# Patient Record
Sex: Female | Born: 1960 | Race: White | Hispanic: No | Smoking: Current every day smoker
Health system: Southern US, Community
[De-identification: ages and names within clinical notes are randomized; demographics above are authoritative.]

## PROBLEM LIST (undated history)

## (undated) DIAGNOSIS — M5020 Other cervical disc displacement, unspecified cervical region: Secondary | ICD-10-CM

## (undated) DIAGNOSIS — M329 Systemic lupus erythematosus, unspecified: Secondary | ICD-10-CM

## (undated) DIAGNOSIS — IMO0002 Reserved for concepts with insufficient information to code with codable children: Secondary | ICD-10-CM

## (undated) DIAGNOSIS — N289 Disorder of kidney and ureter, unspecified: Secondary | ICD-10-CM

## (undated) DIAGNOSIS — G9050 Complex regional pain syndrome I, unspecified: Secondary | ICD-10-CM

## (undated) HISTORY — PX: ABDOMINAL HYSTERECTOMY: SHX81

## (undated) HISTORY — PX: OTHER SURGICAL HISTORY: SHX169

## (undated) HISTORY — PX: LAPAROSCOPIC LUMBAR SYMPATHECTOMY: SUR777

## (undated) HISTORY — PX: CHOLECYSTECTOMY: SHX55

## (undated) HISTORY — PX: ADENOIDECTOMY: SUR15

## (undated) HISTORY — PX: CARPAL TUNNEL RELEASE: SHX101

## (undated) HISTORY — PX: NEUROLYSIS MEDIAN NERVE: SUR892

## (undated) HISTORY — PX: TONSILLECTOMY: SUR1361

---

## 2015-05-02 DIAGNOSIS — N182 Chronic kidney disease, stage 2 (mild): Secondary | ICD-10-CM | POA: Diagnosis not present

## 2015-05-02 DIAGNOSIS — N189 Chronic kidney disease, unspecified: Secondary | ICD-10-CM | POA: Diagnosis not present

## 2015-05-20 DIAGNOSIS — G9050 Complex regional pain syndrome I, unspecified: Secondary | ICD-10-CM | POA: Diagnosis not present

## 2015-05-20 DIAGNOSIS — F1120 Opioid dependence, uncomplicated: Secondary | ICD-10-CM | POA: Diagnosis not present

## 2015-05-20 DIAGNOSIS — M5020 Other cervical disc displacement, unspecified cervical region: Secondary | ICD-10-CM | POA: Diagnosis not present

## 2015-06-18 DIAGNOSIS — Z79891 Long term (current) use of opiate analgesic: Secondary | ICD-10-CM | POA: Diagnosis not present

## 2015-06-18 DIAGNOSIS — M542 Cervicalgia: Secondary | ICD-10-CM | POA: Diagnosis not present

## 2015-06-18 DIAGNOSIS — M79601 Pain in right arm: Secondary | ICD-10-CM | POA: Diagnosis not present

## 2015-06-18 DIAGNOSIS — R202 Paresthesia of skin: Secondary | ICD-10-CM | POA: Diagnosis not present

## 2015-06-18 DIAGNOSIS — M25511 Pain in right shoulder: Secondary | ICD-10-CM | POA: Diagnosis not present

## 2015-06-18 DIAGNOSIS — M545 Low back pain: Secondary | ICD-10-CM | POA: Diagnosis not present

## 2015-06-18 DIAGNOSIS — R200 Anesthesia of skin: Secondary | ICD-10-CM | POA: Diagnosis not present

## 2015-06-18 DIAGNOSIS — G9050 Complex regional pain syndrome I, unspecified: Secondary | ICD-10-CM | POA: Diagnosis not present

## 2015-06-24 ENCOUNTER — Ambulatory Visit
Admission: RE | Admit: 2015-06-24 | Discharge: 2015-06-24 | Disposition: A | Discharge status: Home / Self Care | Payer: PPO | Source: Ambulatory Visit | Attending: Specialist | Admitting: Specialist

## 2015-06-24 ENCOUNTER — Other Ambulatory Visit: Payer: Self-pay | Admitting: Specialist

## 2015-06-24 DIAGNOSIS — M542 Cervicalgia: Secondary | ICD-10-CM

## 2015-06-24 DIAGNOSIS — M50322 Other cervical disc degeneration at C5-C6 level: Secondary | ICD-10-CM | POA: Diagnosis not present

## 2015-06-30 DIAGNOSIS — M5412 Radiculopathy, cervical region: Secondary | ICD-10-CM | POA: Diagnosis not present

## 2015-06-30 DIAGNOSIS — M542 Cervicalgia: Secondary | ICD-10-CM | POA: Diagnosis not present

## 2015-06-30 DIAGNOSIS — M4802 Spinal stenosis, cervical region: Secondary | ICD-10-CM | POA: Diagnosis not present

## 2015-07-03 ENCOUNTER — Other Ambulatory Visit: Payer: Self-pay | Admitting: Neurosurgery

## 2015-07-03 DIAGNOSIS — M5412 Radiculopathy, cervical region: Secondary | ICD-10-CM

## 2015-07-14 ENCOUNTER — Ambulatory Visit
Admission: RE | Admit: 2015-07-14 | Discharge: 2015-07-14 | Disposition: A | Discharge status: Home / Self Care | Payer: PPO | Source: Ambulatory Visit | Attending: Neurosurgery | Admitting: Neurosurgery

## 2015-07-14 DIAGNOSIS — M5030 Other cervical disc degeneration, unspecified cervical region: Secondary | ICD-10-CM | POA: Diagnosis not present

## 2015-07-14 DIAGNOSIS — M5412 Radiculopathy, cervical region: Secondary | ICD-10-CM

## 2015-07-16 DIAGNOSIS — M25511 Pain in right shoulder: Secondary | ICD-10-CM | POA: Diagnosis not present

## 2015-07-16 DIAGNOSIS — R202 Paresthesia of skin: Secondary | ICD-10-CM | POA: Diagnosis not present

## 2015-07-16 DIAGNOSIS — M542 Cervicalgia: Secondary | ICD-10-CM | POA: Diagnosis not present

## 2015-07-16 DIAGNOSIS — G9050 Complex regional pain syndrome I, unspecified: Secondary | ICD-10-CM | POA: Diagnosis not present

## 2015-07-16 DIAGNOSIS — M545 Low back pain: Secondary | ICD-10-CM | POA: Diagnosis not present

## 2015-08-12 DIAGNOSIS — M47812 Spondylosis without myelopathy or radiculopathy, cervical region: Secondary | ICD-10-CM | POA: Diagnosis not present

## 2015-08-12 DIAGNOSIS — M542 Cervicalgia: Secondary | ICD-10-CM | POA: Diagnosis not present

## 2015-08-13 DIAGNOSIS — M542 Cervicalgia: Secondary | ICD-10-CM | POA: Diagnosis not present

## 2015-08-13 DIAGNOSIS — Z79891 Long term (current) use of opiate analgesic: Secondary | ICD-10-CM | POA: Diagnosis not present

## 2015-08-13 DIAGNOSIS — M545 Low back pain: Secondary | ICD-10-CM | POA: Diagnosis not present

## 2015-08-13 DIAGNOSIS — R202 Paresthesia of skin: Secondary | ICD-10-CM | POA: Diagnosis not present

## 2015-08-13 DIAGNOSIS — G9050 Complex regional pain syndrome I, unspecified: Secondary | ICD-10-CM | POA: Diagnosis not present

## 2015-08-13 DIAGNOSIS — M25511 Pain in right shoulder: Secondary | ICD-10-CM | POA: Diagnosis not present

## 2015-08-21 DIAGNOSIS — M25511 Pain in right shoulder: Secondary | ICD-10-CM | POA: Diagnosis not present

## 2015-08-25 DIAGNOSIS — J439 Emphysema, unspecified: Secondary | ICD-10-CM | POA: Diagnosis not present

## 2015-08-25 DIAGNOSIS — M25511 Pain in right shoulder: Secondary | ICD-10-CM | POA: Diagnosis not present

## 2015-08-25 DIAGNOSIS — E559 Vitamin D deficiency, unspecified: Secondary | ICD-10-CM | POA: Diagnosis not present

## 2015-08-25 DIAGNOSIS — R911 Solitary pulmonary nodule: Secondary | ICD-10-CM | POA: Diagnosis not present

## 2015-08-25 DIAGNOSIS — G8929 Other chronic pain: Secondary | ICD-10-CM | POA: Diagnosis not present

## 2015-09-03 DIAGNOSIS — J439 Emphysema, unspecified: Secondary | ICD-10-CM | POA: Diagnosis not present

## 2015-09-03 DIAGNOSIS — R911 Solitary pulmonary nodule: Secondary | ICD-10-CM | POA: Diagnosis not present

## 2015-09-03 DIAGNOSIS — Z87891 Personal history of nicotine dependence: Secondary | ICD-10-CM | POA: Diagnosis not present

## 2015-09-04 DIAGNOSIS — M25511 Pain in right shoulder: Secondary | ICD-10-CM | POA: Diagnosis not present

## 2015-09-04 DIAGNOSIS — M7501 Adhesive capsulitis of right shoulder: Secondary | ICD-10-CM | POA: Diagnosis not present

## 2015-09-08 DIAGNOSIS — M791 Myalgia: Secondary | ICD-10-CM | POA: Diagnosis not present

## 2015-09-08 DIAGNOSIS — M542 Cervicalgia: Secondary | ICD-10-CM | POA: Diagnosis not present

## 2015-09-08 DIAGNOSIS — M545 Low back pain: Secondary | ICD-10-CM | POA: Diagnosis not present

## 2015-09-18 DIAGNOSIS — M25511 Pain in right shoulder: Secondary | ICD-10-CM | POA: Diagnosis not present

## 2015-09-23 DIAGNOSIS — S43431D Superior glenoid labrum lesion of right shoulder, subsequent encounter: Secondary | ICD-10-CM | POA: Diagnosis not present

## 2015-09-23 DIAGNOSIS — M7501 Adhesive capsulitis of right shoulder: Secondary | ICD-10-CM | POA: Diagnosis not present

## 2015-10-09 DIAGNOSIS — M542 Cervicalgia: Secondary | ICD-10-CM | POA: Diagnosis not present

## 2015-10-09 DIAGNOSIS — Z79891 Long term (current) use of opiate analgesic: Secondary | ICD-10-CM | POA: Diagnosis not present

## 2015-10-09 DIAGNOSIS — M545 Low back pain: Secondary | ICD-10-CM | POA: Diagnosis not present

## 2015-10-09 DIAGNOSIS — M791 Myalgia: Secondary | ICD-10-CM | POA: Diagnosis not present

## 2015-11-06 DIAGNOSIS — M791 Myalgia: Secondary | ICD-10-CM | POA: Diagnosis not present

## 2015-11-06 DIAGNOSIS — M542 Cervicalgia: Secondary | ICD-10-CM | POA: Diagnosis not present

## 2015-11-06 DIAGNOSIS — M545 Low back pain: Secondary | ICD-10-CM | POA: Diagnosis not present

## 2015-12-09 DIAGNOSIS — M545 Low back pain: Secondary | ICD-10-CM | POA: Diagnosis not present

## 2015-12-09 DIAGNOSIS — M791 Myalgia: Secondary | ICD-10-CM | POA: Diagnosis not present

## 2015-12-09 DIAGNOSIS — M542 Cervicalgia: Secondary | ICD-10-CM | POA: Diagnosis not present

## 2016-01-06 DIAGNOSIS — M791 Myalgia: Secondary | ICD-10-CM | POA: Diagnosis not present

## 2016-01-06 DIAGNOSIS — Z79891 Long term (current) use of opiate analgesic: Secondary | ICD-10-CM | POA: Diagnosis not present

## 2016-01-06 DIAGNOSIS — M542 Cervicalgia: Secondary | ICD-10-CM | POA: Diagnosis not present

## 2016-01-06 DIAGNOSIS — M545 Low back pain: Secondary | ICD-10-CM | POA: Diagnosis not present

## 2016-01-12 DIAGNOSIS — R918 Other nonspecific abnormal finding of lung field: Secondary | ICD-10-CM | POA: Diagnosis not present

## 2016-01-12 DIAGNOSIS — J438 Other emphysema: Secondary | ICD-10-CM | POA: Diagnosis not present

## 2016-01-12 DIAGNOSIS — N183 Chronic kidney disease, stage 3 (moderate): Secondary | ICD-10-CM | POA: Diagnosis not present

## 2016-02-03 DIAGNOSIS — M791 Myalgia: Secondary | ICD-10-CM | POA: Diagnosis not present

## 2016-02-03 DIAGNOSIS — M542 Cervicalgia: Secondary | ICD-10-CM | POA: Diagnosis not present

## 2016-02-03 DIAGNOSIS — M545 Low back pain: Secondary | ICD-10-CM | POA: Diagnosis not present

## 2016-02-26 DIAGNOSIS — E785 Hyperlipidemia, unspecified: Secondary | ICD-10-CM | POA: Diagnosis not present

## 2016-02-26 DIAGNOSIS — R079 Chest pain, unspecified: Secondary | ICD-10-CM | POA: Diagnosis not present

## 2016-02-26 DIAGNOSIS — Z23 Encounter for immunization: Secondary | ICD-10-CM | POA: Diagnosis not present

## 2016-03-04 DIAGNOSIS — M791 Myalgia: Secondary | ICD-10-CM | POA: Diagnosis not present

## 2016-03-04 DIAGNOSIS — M545 Low back pain: Secondary | ICD-10-CM | POA: Diagnosis not present

## 2016-03-04 DIAGNOSIS — M542 Cervicalgia: Secondary | ICD-10-CM | POA: Diagnosis not present

## 2016-04-01 DIAGNOSIS — Z79891 Long term (current) use of opiate analgesic: Secondary | ICD-10-CM | POA: Diagnosis not present

## 2016-04-01 DIAGNOSIS — M545 Low back pain: Secondary | ICD-10-CM | POA: Diagnosis not present

## 2016-04-01 DIAGNOSIS — M542 Cervicalgia: Secondary | ICD-10-CM | POA: Diagnosis not present

## 2016-04-01 DIAGNOSIS — M791 Myalgia: Secondary | ICD-10-CM | POA: Diagnosis not present

## 2016-04-29 DIAGNOSIS — M545 Low back pain: Secondary | ICD-10-CM | POA: Diagnosis not present

## 2016-04-29 DIAGNOSIS — M791 Myalgia: Secondary | ICD-10-CM | POA: Diagnosis not present

## 2016-04-29 DIAGNOSIS — Z79891 Long term (current) use of opiate analgesic: Secondary | ICD-10-CM | POA: Diagnosis not present

## 2016-04-29 DIAGNOSIS — M542 Cervicalgia: Secondary | ICD-10-CM | POA: Diagnosis not present

## 2016-05-27 DIAGNOSIS — M545 Low back pain: Secondary | ICD-10-CM | POA: Diagnosis not present

## 2016-05-27 DIAGNOSIS — M542 Cervicalgia: Secondary | ICD-10-CM | POA: Diagnosis not present

## 2016-05-27 DIAGNOSIS — M791 Myalgia: Secondary | ICD-10-CM | POA: Diagnosis not present

## 2016-06-25 DIAGNOSIS — E785 Hyperlipidemia, unspecified: Secondary | ICD-10-CM | POA: Diagnosis not present

## 2016-07-20 DIAGNOSIS — M545 Low back pain: Secondary | ICD-10-CM | POA: Diagnosis not present

## 2016-07-20 DIAGNOSIS — M791 Myalgia: Secondary | ICD-10-CM | POA: Diagnosis not present

## 2016-07-20 DIAGNOSIS — M542 Cervicalgia: Secondary | ICD-10-CM | POA: Diagnosis not present

## 2016-07-20 DIAGNOSIS — Z79891 Long term (current) use of opiate analgesic: Secondary | ICD-10-CM | POA: Diagnosis not present

## 2016-08-18 DIAGNOSIS — M542 Cervicalgia: Secondary | ICD-10-CM | POA: Diagnosis not present

## 2016-08-18 DIAGNOSIS — M791 Myalgia: Secondary | ICD-10-CM | POA: Diagnosis not present

## 2016-08-18 DIAGNOSIS — M545 Low back pain: Secondary | ICD-10-CM | POA: Diagnosis not present

## 2016-09-15 DIAGNOSIS — E785 Hyperlipidemia, unspecified: Secondary | ICD-10-CM | POA: Diagnosis not present

## 2016-09-15 DIAGNOSIS — N189 Chronic kidney disease, unspecified: Secondary | ICD-10-CM | POA: Diagnosis not present

## 2016-09-15 DIAGNOSIS — G9050 Complex regional pain syndrome I, unspecified: Secondary | ICD-10-CM | POA: Diagnosis not present

## 2016-09-21 DIAGNOSIS — M545 Low back pain: Secondary | ICD-10-CM | POA: Diagnosis not present

## 2016-09-21 DIAGNOSIS — M791 Myalgia: Secondary | ICD-10-CM | POA: Diagnosis not present

## 2016-09-21 DIAGNOSIS — E785 Hyperlipidemia, unspecified: Secondary | ICD-10-CM | POA: Diagnosis not present

## 2016-09-21 DIAGNOSIS — M542 Cervicalgia: Secondary | ICD-10-CM | POA: Diagnosis not present

## 2016-09-24 DIAGNOSIS — S238XXA Sprain of other specified parts of thorax, initial encounter: Secondary | ICD-10-CM | POA: Diagnosis not present

## 2016-09-28 DIAGNOSIS — N182 Chronic kidney disease, stage 2 (mild): Secondary | ICD-10-CM | POA: Diagnosis not present

## 2016-09-28 DIAGNOSIS — N183 Chronic kidney disease, stage 3 (moderate): Secondary | ICD-10-CM | POA: Diagnosis not present

## 2016-09-28 DIAGNOSIS — D649 Anemia, unspecified: Secondary | ICD-10-CM | POA: Diagnosis not present

## 2016-09-28 DIAGNOSIS — N281 Cyst of kidney, acquired: Secondary | ICD-10-CM | POA: Diagnosis not present

## 2016-10-19 DIAGNOSIS — M545 Low back pain: Secondary | ICD-10-CM | POA: Diagnosis not present

## 2016-10-19 DIAGNOSIS — G9050 Complex regional pain syndrome I, unspecified: Secondary | ICD-10-CM | POA: Diagnosis not present

## 2016-10-19 DIAGNOSIS — M791 Myalgia: Secondary | ICD-10-CM | POA: Diagnosis not present

## 2016-10-19 DIAGNOSIS — Z79891 Long term (current) use of opiate analgesic: Secondary | ICD-10-CM | POA: Diagnosis not present

## 2016-10-19 DIAGNOSIS — M542 Cervicalgia: Secondary | ICD-10-CM | POA: Diagnosis not present

## 2016-11-17 DIAGNOSIS — Z79891 Long term (current) use of opiate analgesic: Secondary | ICD-10-CM | POA: Diagnosis not present

## 2016-11-17 DIAGNOSIS — M329 Systemic lupus erythematosus, unspecified: Secondary | ICD-10-CM | POA: Diagnosis not present

## 2016-11-17 DIAGNOSIS — M542 Cervicalgia: Secondary | ICD-10-CM | POA: Diagnosis not present

## 2016-11-17 DIAGNOSIS — M791 Myalgia: Secondary | ICD-10-CM | POA: Diagnosis not present

## 2016-11-17 DIAGNOSIS — M545 Low back pain: Secondary | ICD-10-CM | POA: Diagnosis not present

## 2016-11-18 DIAGNOSIS — R0600 Dyspnea, unspecified: Secondary | ICD-10-CM | POA: Diagnosis not present

## 2016-11-18 DIAGNOSIS — Z122 Encounter for screening for malignant neoplasm of respiratory organs: Secondary | ICD-10-CM | POA: Diagnosis not present

## 2016-11-18 DIAGNOSIS — F17210 Nicotine dependence, cigarettes, uncomplicated: Secondary | ICD-10-CM | POA: Diagnosis not present

## 2016-11-18 DIAGNOSIS — J449 Chronic obstructive pulmonary disease, unspecified: Secondary | ICD-10-CM | POA: Diagnosis not present

## 2016-12-03 IMAGING — CR DG CERVICAL SPINE COMPLETE 4+V
5 series · 5 of 5 positions shown · non-contrast
Comparison: None in PACs

CLINICAL DATA: Chronic pain with increased symptoms over the past 6
weeks with numbness extending to the right fingers. Limited range of
motion ; history of end-stage renal disease.

EXAM:
CERVICAL SPINE - COMPLETE 4+ VIEW

[w cervical spine lat]
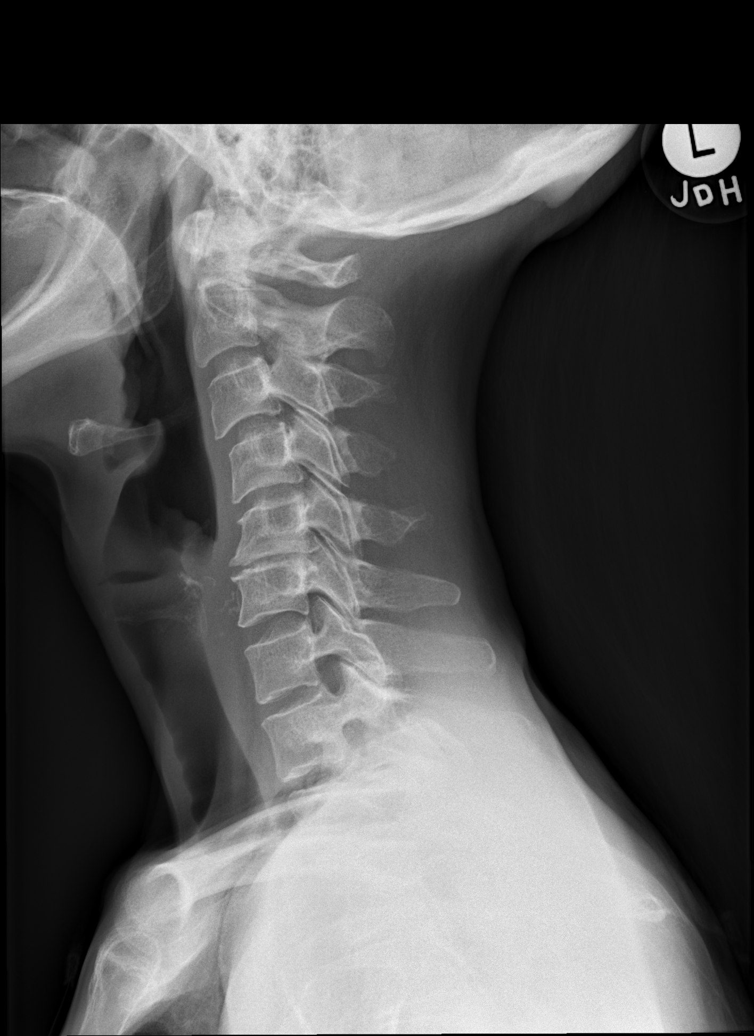

[w cervical spine ap_obl (1 of 2)]
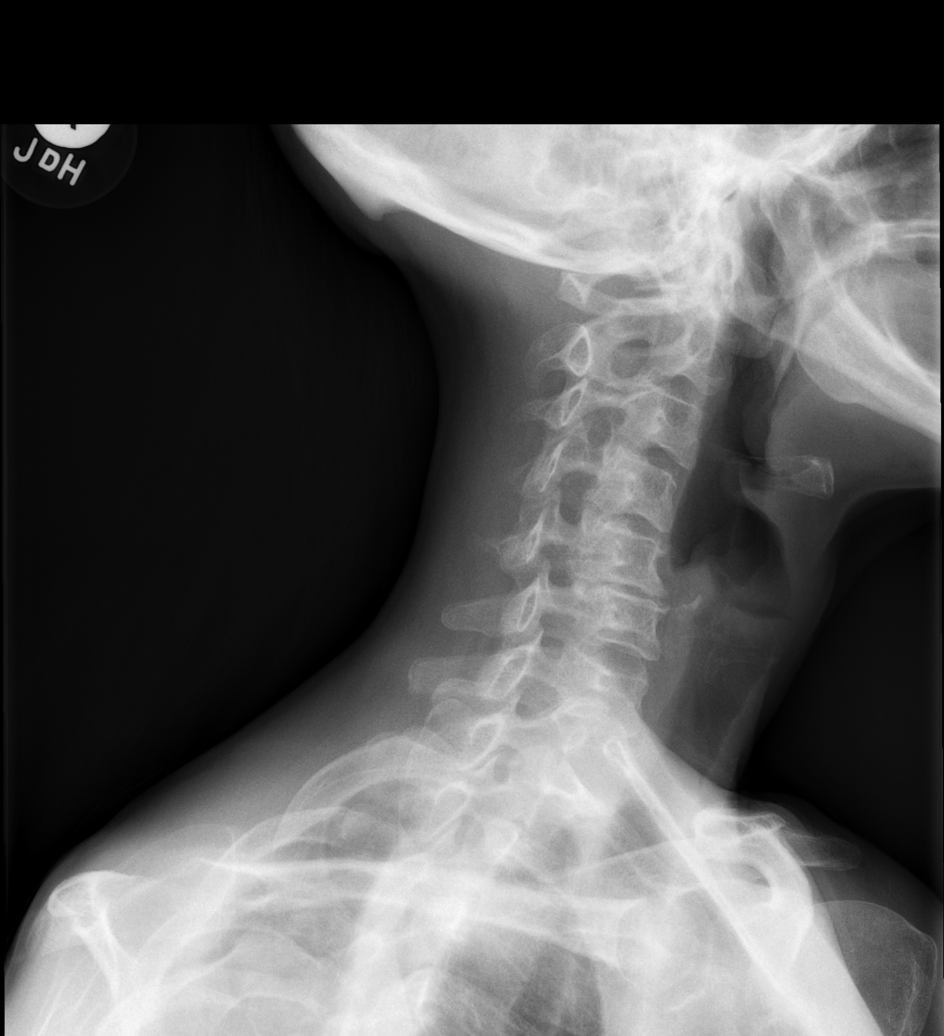

[w cervical spine ap_obl (2 of 2)]
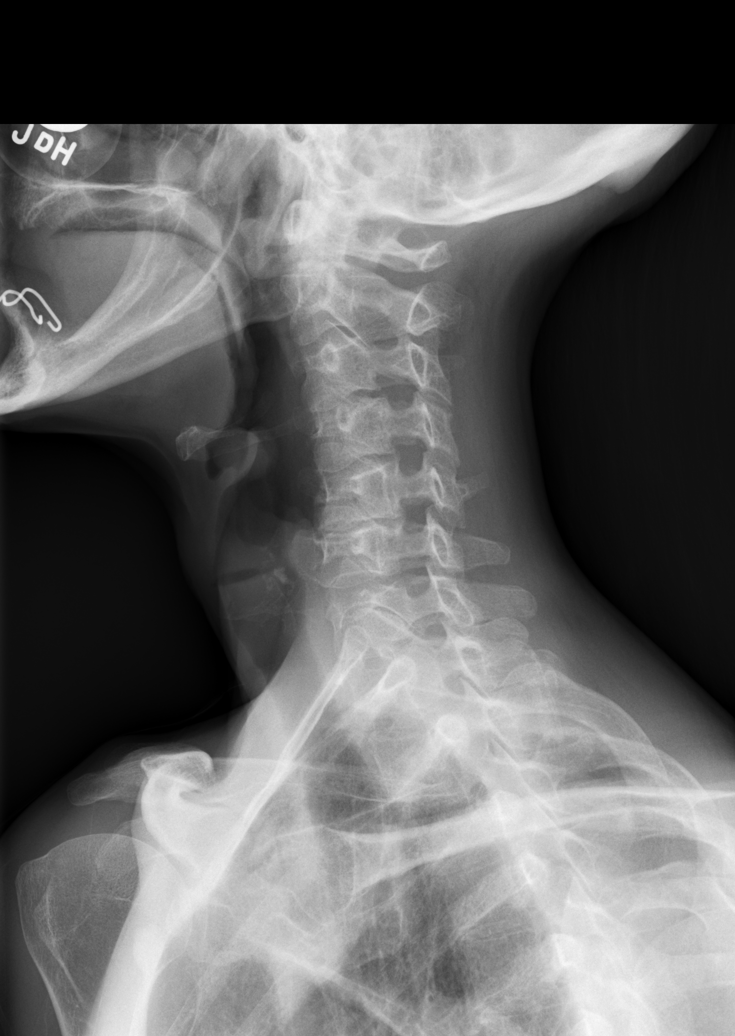

[w cervical spine ap]
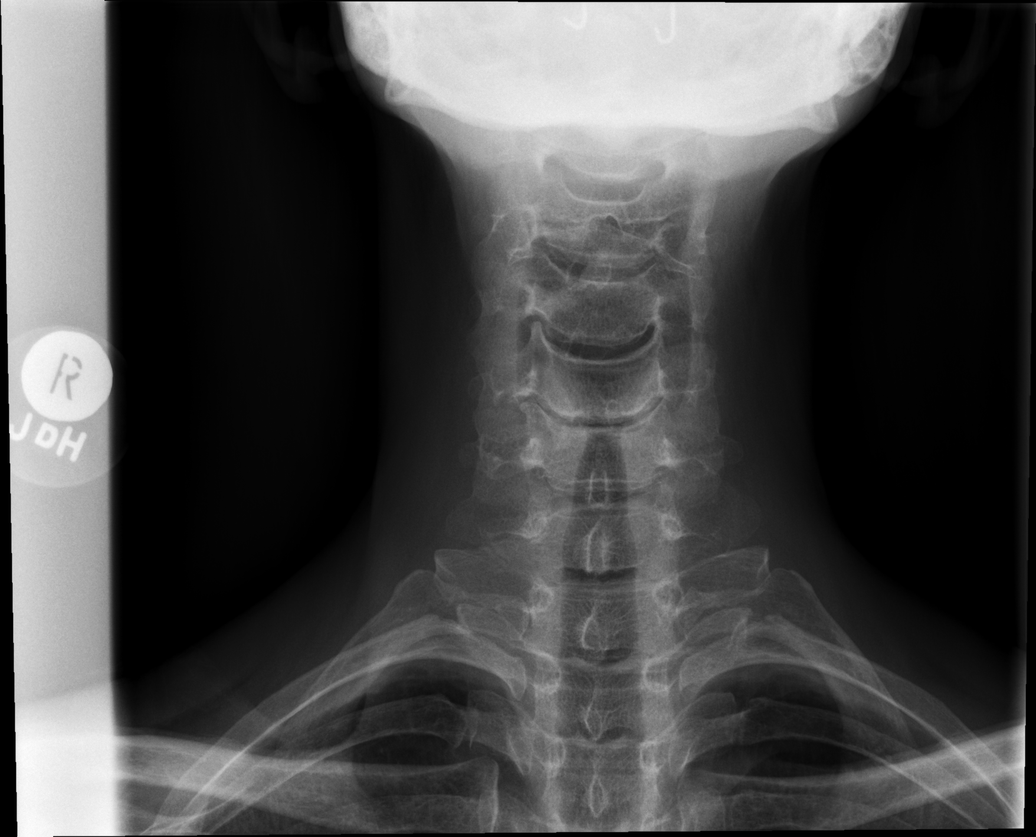

[t cervical spine odontoid]
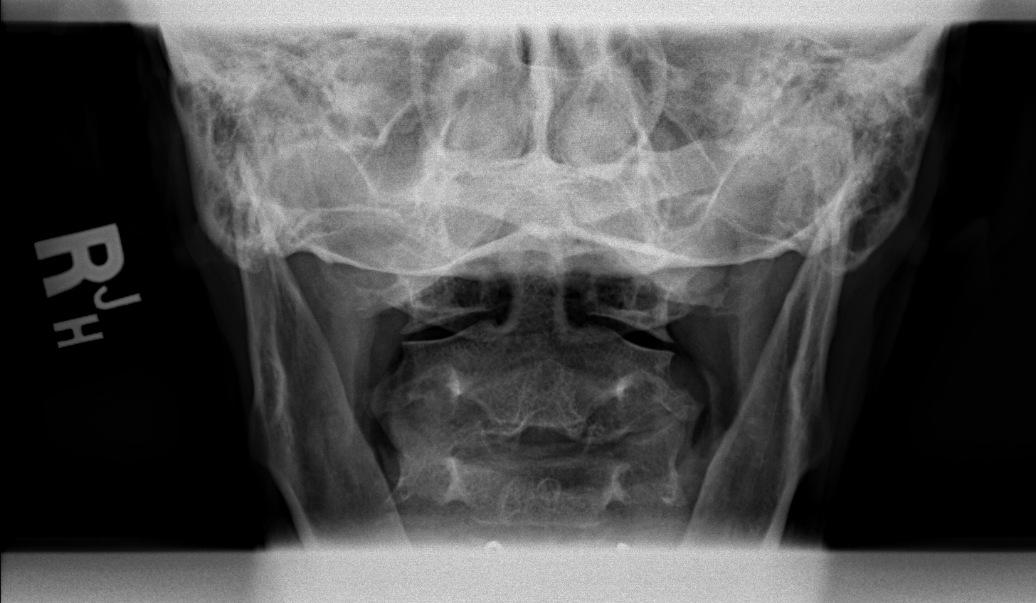

[5 of 5 positions shown; findings below may reference images not displayed]

FINDINGS: There is mild reversal of the normal cervical lordosis. The cervical
vertebral bodies are preserved in height. There is moderate disc
space narrowing at C5-6. The other disc space heights are well
maintained. There is no perched facet. The oblique views reveal no
significant bony encroachment upon the neural foramina. The odontoid
and the spinous processes are intact. The prevertebral soft tissue
spaces are normal.
IMPRESSION: Moderate degenerative disc space narrowing with endplate spurring at
C5-6. The remainder of the cervical disc levels appear normal. Given
the patient's progressive symptoms with left upper extremity
radicular findings, cervical spine MRI would be useful.

## 2016-12-16 DIAGNOSIS — M545 Low back pain: Secondary | ICD-10-CM | POA: Diagnosis not present

## 2016-12-16 DIAGNOSIS — G9050 Complex regional pain syndrome I, unspecified: Secondary | ICD-10-CM | POA: Diagnosis not present

## 2016-12-16 DIAGNOSIS — M542 Cervicalgia: Secondary | ICD-10-CM | POA: Diagnosis not present

## 2016-12-16 DIAGNOSIS — M791 Myalgia: Secondary | ICD-10-CM | POA: Diagnosis not present

## 2016-12-29 DIAGNOSIS — Z87891 Personal history of nicotine dependence: Secondary | ICD-10-CM | POA: Diagnosis not present

## 2017-01-11 DIAGNOSIS — Z122 Encounter for screening for malignant neoplasm of respiratory organs: Secondary | ICD-10-CM | POA: Diagnosis not present

## 2017-01-11 DIAGNOSIS — F17210 Nicotine dependence, cigarettes, uncomplicated: Secondary | ICD-10-CM | POA: Diagnosis not present

## 2017-01-11 DIAGNOSIS — J449 Chronic obstructive pulmonary disease, unspecified: Secondary | ICD-10-CM | POA: Diagnosis not present

## 2017-01-20 DIAGNOSIS — Z79891 Long term (current) use of opiate analgesic: Secondary | ICD-10-CM | POA: Diagnosis not present

## 2017-01-20 DIAGNOSIS — M545 Low back pain: Secondary | ICD-10-CM | POA: Diagnosis not present

## 2017-01-20 DIAGNOSIS — M542 Cervicalgia: Secondary | ICD-10-CM | POA: Diagnosis not present

## 2017-01-20 DIAGNOSIS — G9050 Complex regional pain syndrome I, unspecified: Secondary | ICD-10-CM | POA: Diagnosis not present

## 2017-01-20 DIAGNOSIS — M791 Myalgia: Secondary | ICD-10-CM | POA: Diagnosis not present

## 2017-02-17 DIAGNOSIS — M542 Cervicalgia: Secondary | ICD-10-CM | POA: Diagnosis not present

## 2017-02-17 DIAGNOSIS — M791 Myalgia, unspecified site: Secondary | ICD-10-CM | POA: Diagnosis not present

## 2017-02-17 DIAGNOSIS — G9050 Complex regional pain syndrome I, unspecified: Secondary | ICD-10-CM | POA: Diagnosis not present

## 2017-02-17 DIAGNOSIS — M545 Low back pain: Secondary | ICD-10-CM | POA: Diagnosis not present

## 2017-04-13 DIAGNOSIS — M791 Myalgia, unspecified site: Secondary | ICD-10-CM | POA: Diagnosis not present

## 2017-04-13 DIAGNOSIS — M542 Cervicalgia: Secondary | ICD-10-CM | POA: Diagnosis not present

## 2017-04-13 DIAGNOSIS — G9050 Complex regional pain syndrome I, unspecified: Secondary | ICD-10-CM | POA: Diagnosis not present

## 2017-04-13 DIAGNOSIS — M545 Low back pain: Secondary | ICD-10-CM | POA: Diagnosis not present

## 2017-05-17 DIAGNOSIS — G894 Chronic pain syndrome: Secondary | ICD-10-CM | POA: Diagnosis not present

## 2017-05-17 DIAGNOSIS — G90512 Complex regional pain syndrome I of left upper limb: Secondary | ICD-10-CM | POA: Diagnosis not present

## 2017-05-17 DIAGNOSIS — Z79891 Long term (current) use of opiate analgesic: Secondary | ICD-10-CM | POA: Diagnosis not present

## 2017-05-17 DIAGNOSIS — M542 Cervicalgia: Secondary | ICD-10-CM | POA: Diagnosis not present

## 2017-05-17 DIAGNOSIS — M545 Low back pain: Secondary | ICD-10-CM | POA: Diagnosis not present

## 2017-06-20 DIAGNOSIS — G90512 Complex regional pain syndrome I of left upper limb: Secondary | ICD-10-CM | POA: Diagnosis not present

## 2017-06-20 DIAGNOSIS — Z79891 Long term (current) use of opiate analgesic: Secondary | ICD-10-CM | POA: Diagnosis not present

## 2017-06-20 DIAGNOSIS — M545 Low back pain: Secondary | ICD-10-CM | POA: Diagnosis not present

## 2017-06-20 DIAGNOSIS — G894 Chronic pain syndrome: Secondary | ICD-10-CM | POA: Diagnosis not present

## 2017-06-20 DIAGNOSIS — M542 Cervicalgia: Secondary | ICD-10-CM | POA: Diagnosis not present

## 2017-07-07 DIAGNOSIS — F17200 Nicotine dependence, unspecified, uncomplicated: Secondary | ICD-10-CM | POA: Diagnosis not present

## 2017-07-07 DIAGNOSIS — J439 Emphysema, unspecified: Secondary | ICD-10-CM | POA: Diagnosis not present

## 2017-07-14 DIAGNOSIS — M25512 Pain in left shoulder: Secondary | ICD-10-CM | POA: Diagnosis not present

## 2017-07-14 DIAGNOSIS — S199XXA Unspecified injury of neck, initial encounter: Secondary | ICD-10-CM | POA: Diagnosis not present

## 2017-07-14 DIAGNOSIS — S4992XA Unspecified injury of left shoulder and upper arm, initial encounter: Secondary | ICD-10-CM | POA: Diagnosis not present

## 2017-07-14 DIAGNOSIS — M542 Cervicalgia: Secondary | ICD-10-CM | POA: Diagnosis not present

## 2017-07-14 DIAGNOSIS — Z1231 Encounter for screening mammogram for malignant neoplasm of breast: Secondary | ICD-10-CM | POA: Diagnosis not present

## 2017-07-14 DIAGNOSIS — G9050 Complex regional pain syndrome I, unspecified: Secondary | ICD-10-CM | POA: Diagnosis not present

## 2017-07-14 DIAGNOSIS — M50322 Other cervical disc degeneration at C5-C6 level: Secondary | ICD-10-CM | POA: Diagnosis not present

## 2017-07-19 DIAGNOSIS — G894 Chronic pain syndrome: Secondary | ICD-10-CM | POA: Diagnosis not present

## 2017-07-19 DIAGNOSIS — G90512 Complex regional pain syndrome I of left upper limb: Secondary | ICD-10-CM | POA: Diagnosis not present

## 2017-07-19 DIAGNOSIS — M542 Cervicalgia: Secondary | ICD-10-CM | POA: Diagnosis not present

## 2017-07-19 DIAGNOSIS — M545 Low back pain: Secondary | ICD-10-CM | POA: Diagnosis not present

## 2017-07-29 DIAGNOSIS — Z1231 Encounter for screening mammogram for malignant neoplasm of breast: Secondary | ICD-10-CM | POA: Diagnosis not present

## 2017-08-01 DIAGNOSIS — E559 Vitamin D deficiency, unspecified: Secondary | ICD-10-CM | POA: Diagnosis not present

## 2017-08-01 DIAGNOSIS — N183 Chronic kidney disease, stage 3 (moderate): Secondary | ICD-10-CM | POA: Diagnosis not present

## 2017-08-01 DIAGNOSIS — N200 Calculus of kidney: Secondary | ICD-10-CM | POA: Diagnosis not present

## 2017-08-17 DIAGNOSIS — G894 Chronic pain syndrome: Secondary | ICD-10-CM | POA: Diagnosis not present

## 2017-08-17 DIAGNOSIS — M329 Systemic lupus erythematosus, unspecified: Secondary | ICD-10-CM | POA: Diagnosis not present

## 2017-08-17 DIAGNOSIS — Z79891 Long term (current) use of opiate analgesic: Secondary | ICD-10-CM | POA: Diagnosis not present

## 2017-08-17 DIAGNOSIS — M542 Cervicalgia: Secondary | ICD-10-CM | POA: Diagnosis not present

## 2017-08-17 DIAGNOSIS — M791 Myalgia, unspecified site: Secondary | ICD-10-CM | POA: Diagnosis not present

## 2017-08-17 DIAGNOSIS — M545 Low back pain: Secondary | ICD-10-CM | POA: Diagnosis not present

## 2017-09-14 DIAGNOSIS — M542 Cervicalgia: Secondary | ICD-10-CM | POA: Diagnosis not present

## 2017-09-14 DIAGNOSIS — M159 Polyosteoarthritis, unspecified: Secondary | ICD-10-CM | POA: Diagnosis not present

## 2017-09-14 DIAGNOSIS — M545 Low back pain: Secondary | ICD-10-CM | POA: Diagnosis not present

## 2017-09-14 DIAGNOSIS — M791 Myalgia, unspecified site: Secondary | ICD-10-CM | POA: Diagnosis not present

## 2017-10-12 DIAGNOSIS — M159 Polyosteoarthritis, unspecified: Secondary | ICD-10-CM | POA: Diagnosis not present

## 2017-10-12 DIAGNOSIS — M329 Systemic lupus erythematosus, unspecified: Secondary | ICD-10-CM | POA: Diagnosis not present

## 2017-10-12 DIAGNOSIS — M545 Low back pain: Secondary | ICD-10-CM | POA: Diagnosis not present

## 2017-10-12 DIAGNOSIS — G894 Chronic pain syndrome: Secondary | ICD-10-CM | POA: Diagnosis not present

## 2017-11-16 DIAGNOSIS — Z79891 Long term (current) use of opiate analgesic: Secondary | ICD-10-CM | POA: Diagnosis not present

## 2017-11-16 DIAGNOSIS — M542 Cervicalgia: Secondary | ICD-10-CM | POA: Diagnosis not present

## 2017-11-16 DIAGNOSIS — M545 Low back pain: Secondary | ICD-10-CM | POA: Diagnosis not present

## 2017-11-16 DIAGNOSIS — M159 Polyosteoarthritis, unspecified: Secondary | ICD-10-CM | POA: Diagnosis not present

## 2017-11-16 DIAGNOSIS — G894 Chronic pain syndrome: Secondary | ICD-10-CM | POA: Diagnosis not present

## 2017-12-14 DIAGNOSIS — G90512 Complex regional pain syndrome I of left upper limb: Secondary | ICD-10-CM | POA: Diagnosis not present

## 2017-12-14 DIAGNOSIS — G894 Chronic pain syndrome: Secondary | ICD-10-CM | POA: Diagnosis not present

## 2017-12-14 DIAGNOSIS — M791 Myalgia, unspecified site: Secondary | ICD-10-CM | POA: Diagnosis not present

## 2017-12-14 DIAGNOSIS — M542 Cervicalgia: Secondary | ICD-10-CM | POA: Diagnosis not present

## 2017-12-14 DIAGNOSIS — M545 Low back pain: Secondary | ICD-10-CM | POA: Diagnosis not present

## 2017-12-14 DIAGNOSIS — M329 Systemic lupus erythematosus, unspecified: Secondary | ICD-10-CM | POA: Diagnosis not present

## 2017-12-14 DIAGNOSIS — M159 Polyosteoarthritis, unspecified: Secondary | ICD-10-CM | POA: Diagnosis not present

## 2018-01-11 DIAGNOSIS — G894 Chronic pain syndrome: Secondary | ICD-10-CM | POA: Diagnosis not present

## 2018-01-11 DIAGNOSIS — M791 Myalgia, unspecified site: Secondary | ICD-10-CM | POA: Diagnosis not present

## 2018-01-11 DIAGNOSIS — R636 Underweight: Secondary | ICD-10-CM | POA: Diagnosis not present

## 2018-01-11 DIAGNOSIS — M542 Cervicalgia: Secondary | ICD-10-CM | POA: Diagnosis not present

## 2018-01-11 DIAGNOSIS — M545 Low back pain: Secondary | ICD-10-CM | POA: Diagnosis not present

## 2018-01-11 DIAGNOSIS — M159 Polyosteoarthritis, unspecified: Secondary | ICD-10-CM | POA: Diagnosis not present

## 2018-01-11 DIAGNOSIS — G90512 Complex regional pain syndrome I of left upper limb: Secondary | ICD-10-CM | POA: Diagnosis not present

## 2018-01-11 DIAGNOSIS — M329 Systemic lupus erythematosus, unspecified: Secondary | ICD-10-CM | POA: Diagnosis not present

## 2018-01-19 DIAGNOSIS — Z87891 Personal history of nicotine dependence: Secondary | ICD-10-CM | POA: Diagnosis not present

## 2018-01-19 DIAGNOSIS — F17200 Nicotine dependence, unspecified, uncomplicated: Secondary | ICD-10-CM | POA: Diagnosis not present

## 2018-01-19 DIAGNOSIS — R911 Solitary pulmonary nodule: Secondary | ICD-10-CM | POA: Diagnosis not present

## 2018-01-19 DIAGNOSIS — R634 Abnormal weight loss: Secondary | ICD-10-CM | POA: Diagnosis not present

## 2018-01-31 DIAGNOSIS — R911 Solitary pulmonary nodule: Secondary | ICD-10-CM | POA: Diagnosis not present

## 2018-01-31 DIAGNOSIS — F17210 Nicotine dependence, cigarettes, uncomplicated: Secondary | ICD-10-CM | POA: Diagnosis not present

## 2018-01-31 DIAGNOSIS — F17219 Nicotine dependence, cigarettes, with unspecified nicotine-induced disorders: Secondary | ICD-10-CM | POA: Diagnosis not present

## 2018-01-31 DIAGNOSIS — Z720 Tobacco use: Secondary | ICD-10-CM | POA: Diagnosis not present

## 2018-01-31 DIAGNOSIS — Z122 Encounter for screening for malignant neoplasm of respiratory organs: Secondary | ICD-10-CM | POA: Diagnosis not present

## 2018-01-31 DIAGNOSIS — F17200 Nicotine dependence, unspecified, uncomplicated: Secondary | ICD-10-CM | POA: Diagnosis not present

## 2018-02-08 DIAGNOSIS — M545 Low back pain: Secondary | ICD-10-CM | POA: Diagnosis not present

## 2018-02-08 DIAGNOSIS — M159 Polyosteoarthritis, unspecified: Secondary | ICD-10-CM | POA: Diagnosis not present

## 2018-02-08 DIAGNOSIS — G90512 Complex regional pain syndrome I of left upper limb: Secondary | ICD-10-CM | POA: Diagnosis not present

## 2018-02-08 DIAGNOSIS — Z79891 Long term (current) use of opiate analgesic: Secondary | ICD-10-CM | POA: Diagnosis not present

## 2018-02-08 DIAGNOSIS — M329 Systemic lupus erythematosus, unspecified: Secondary | ICD-10-CM | POA: Diagnosis not present

## 2018-02-08 DIAGNOSIS — M542 Cervicalgia: Secondary | ICD-10-CM | POA: Diagnosis not present

## 2018-02-08 DIAGNOSIS — M791 Myalgia, unspecified site: Secondary | ICD-10-CM | POA: Diagnosis not present

## 2018-02-08 DIAGNOSIS — G894 Chronic pain syndrome: Secondary | ICD-10-CM | POA: Diagnosis not present

## 2018-03-08 DIAGNOSIS — M791 Myalgia, unspecified site: Secondary | ICD-10-CM | POA: Diagnosis not present

## 2018-03-08 DIAGNOSIS — G894 Chronic pain syndrome: Secondary | ICD-10-CM | POA: Diagnosis not present

## 2018-03-08 DIAGNOSIS — M329 Systemic lupus erythematosus, unspecified: Secondary | ICD-10-CM | POA: Diagnosis not present

## 2018-03-08 DIAGNOSIS — M545 Low back pain: Secondary | ICD-10-CM | POA: Diagnosis not present

## 2018-03-08 DIAGNOSIS — M159 Polyosteoarthritis, unspecified: Secondary | ICD-10-CM | POA: Diagnosis not present

## 2018-03-08 DIAGNOSIS — G90512 Complex regional pain syndrome I of left upper limb: Secondary | ICD-10-CM | POA: Diagnosis not present

## 2018-03-08 DIAGNOSIS — M542 Cervicalgia: Secondary | ICD-10-CM | POA: Diagnosis not present

## 2018-04-04 DIAGNOSIS — Z122 Encounter for screening for malignant neoplasm of respiratory organs: Secondary | ICD-10-CM | POA: Diagnosis not present

## 2018-04-04 DIAGNOSIS — R918 Other nonspecific abnormal finding of lung field: Secondary | ICD-10-CM | POA: Diagnosis not present

## 2018-04-04 DIAGNOSIS — F17219 Nicotine dependence, cigarettes, with unspecified nicotine-induced disorders: Secondary | ICD-10-CM | POA: Diagnosis not present

## 2018-04-04 DIAGNOSIS — Z87891 Personal history of nicotine dependence: Secondary | ICD-10-CM | POA: Diagnosis not present

## 2018-04-10 DIAGNOSIS — G90512 Complex regional pain syndrome I of left upper limb: Secondary | ICD-10-CM | POA: Diagnosis not present

## 2018-04-10 DIAGNOSIS — Z79891 Long term (current) use of opiate analgesic: Secondary | ICD-10-CM | POA: Diagnosis not present

## 2018-04-10 DIAGNOSIS — G894 Chronic pain syndrome: Secondary | ICD-10-CM | POA: Diagnosis not present

## 2018-04-10 DIAGNOSIS — M159 Polyosteoarthritis, unspecified: Secondary | ICD-10-CM | POA: Diagnosis not present

## 2018-04-10 DIAGNOSIS — M329 Systemic lupus erythematosus, unspecified: Secondary | ICD-10-CM | POA: Diagnosis not present

## 2018-04-10 DIAGNOSIS — M542 Cervicalgia: Secondary | ICD-10-CM | POA: Diagnosis not present

## 2018-04-11 DIAGNOSIS — M1990 Unspecified osteoarthritis, unspecified site: Secondary | ICD-10-CM | POA: Diagnosis not present

## 2018-04-11 DIAGNOSIS — F17219 Nicotine dependence, cigarettes, with unspecified nicotine-induced disorders: Secondary | ICD-10-CM | POA: Diagnosis not present

## 2018-04-11 DIAGNOSIS — Z886 Allergy status to analgesic agent status: Secondary | ICD-10-CM | POA: Diagnosis not present

## 2018-04-11 DIAGNOSIS — G9050 Complex regional pain syndrome I, unspecified: Secondary | ICD-10-CM | POA: Diagnosis not present

## 2018-04-11 DIAGNOSIS — Z882 Allergy status to sulfonamides status: Secondary | ICD-10-CM | POA: Diagnosis not present

## 2018-04-11 DIAGNOSIS — Z79899 Other long term (current) drug therapy: Secondary | ICD-10-CM | POA: Diagnosis not present

## 2018-04-11 DIAGNOSIS — Z885 Allergy status to narcotic agent status: Secondary | ICD-10-CM | POA: Diagnosis not present

## 2018-04-11 DIAGNOSIS — N182 Chronic kidney disease, stage 2 (mild): Secondary | ICD-10-CM | POA: Diagnosis not present

## 2018-04-11 DIAGNOSIS — Z9049 Acquired absence of other specified parts of digestive tract: Secondary | ICD-10-CM | POA: Diagnosis not present

## 2018-04-11 DIAGNOSIS — F17210 Nicotine dependence, cigarettes, uncomplicated: Secondary | ICD-10-CM | POA: Diagnosis not present

## 2018-04-11 DIAGNOSIS — J310 Chronic rhinitis: Secondary | ICD-10-CM | POA: Diagnosis not present

## 2018-04-11 DIAGNOSIS — J449 Chronic obstructive pulmonary disease, unspecified: Secondary | ICD-10-CM | POA: Diagnosis not present

## 2018-04-22 ENCOUNTER — Ambulatory Visit (HOSPITAL_COMMUNITY)
Admission: EM | Admit: 2018-04-22 | Discharge: 2018-04-22 | Disposition: A | Discharge status: Home / Self Care | Payer: PPO | Attending: Family Medicine | Admitting: Family Medicine

## 2018-04-22 ENCOUNTER — Other Ambulatory Visit: Payer: Self-pay

## 2018-04-22 ENCOUNTER — Encounter (HOSPITAL_COMMUNITY): Payer: Self-pay | Admitting: Emergency Medicine

## 2018-04-22 DIAGNOSIS — R197 Diarrhea, unspecified: Secondary | ICD-10-CM

## 2018-04-22 DIAGNOSIS — R112 Nausea with vomiting, unspecified: Secondary | ICD-10-CM | POA: Diagnosis not present

## 2018-04-22 DIAGNOSIS — R05 Cough: Secondary | ICD-10-CM | POA: Diagnosis not present

## 2018-04-22 DIAGNOSIS — R059 Cough, unspecified: Secondary | ICD-10-CM

## 2018-04-22 HISTORY — DX: Disorder of kidney and ureter, unspecified: N28.9

## 2018-04-22 HISTORY — DX: Complex regional pain syndrome I, unspecified: G90.50

## 2018-04-22 MED ORDER — ONDANSETRON 4 MG PO TBDP: 4.0000 mg | ORAL_TABLET | Freq: Three times a day (TID) | ORAL | 0 refills | Status: DC | PRN

## 2018-04-22 MED ORDER — PSEUDOEPH-BROMPHEN-DM 30-2-10 MG/5ML PO SYRP: 5.0000 mL | ORAL_SOLUTION | Freq: Four times a day (QID) | ORAL | 0 refills | Status: DC | PRN

## 2018-04-22 NOTE — ED Provider Notes (Signed)
[facility]    CSN: [id number] Arrival date & time: 04/22/18  1148     History   Chief Complaint Chief Complaint  Patient presents with  . Nausea    HPI Teresa George is a 57 y.o. female history of CKD, reflux sympathetic dystrophy, chronic pain, history of emphysema, presenting today for evaluation of nausea vomiting and diarrhea.  Symptoms began yesterday evening.  She has had frequent loose bowel movements.  She is also had difficulty maintaining solids.  She has been able to tolerate ginger ale today.  Low-grade fevers.  Notes that she was recently treated for flu over the past 1 to 2 weeks, symptoms have largely improved, but she has had a persistent cough.  She was placed on a course of doxycycline to treat for sinus infection as well recently.  Has some mild cramping discomfort.  Previous cholecystectomy and hysterectomy.  Has had exploratory surgery on her abdomen as well.  HPI  Past Medical History:  Diagnosis Date  . Reflex sympathetic dystrophy   . Renal disorder     There are no active problems to display for this patient.   History reviewed. No pertinent surgical history.  OB History   No obstetric history on file.      Home Medications    Prior to Admission medications   Medication Sig Start Date End Date Taking? Authorizing Provider  albuterol (PROVENTIL HFA;VENTOLIN HFA) 108 (90 Base) MCG/ACT inhaler Inhale into the lungs. 10/03/14  Yes [provider]  Buprenorphine HCl (BELBUCA) 450 MCG FILM PLACE 1 FILM INSIDE CHEEKS TWICE DAILY 06/24/17  Yes [provider]  Cholecalciferol (VITAMIN D3) 50 MCG (2000 UT) capsule Take by mouth. 08/04/17 08/04/18 Yes [provider]  fluticasone (FLONASE) 50 MCG/ACT nasal spray 1 spray by Each Nare route daily. 04/11/18 04/11/19 Yes [provider]  oxycodone (ROXICODONE) 30 MG immediate release tablet Take by mouth. 03/26/14  Yes [provider]  baclofen (LIORESAL)  10 MG tablet Take by mouth.    [provider]  brompheniramine-pseudoephedrine-DM 30-2-10 MG/5ML syrup Take 5 mLs by mouth 4 (four) times daily as needed. 04/22/18   Deah Ottaway C, PA-C  ondansetron (ZOFRAN ODT) 4 MG disintegrating tablet Take 1 tablet (4 mg total) by mouth every 8 (eight) hours as needed for nausea or vomiting. 04/22/18   Jassiah Viviano, Junius CreamerHallie C, PA-C    Family History History reviewed. No pertinent family history.  Social History Social History   Tobacco Use  . Smoking status: Current Every Day Smoker    Packs/day: 0.50    Types: Cigarettes  . Smokeless tobacco: Never Used  Substance Use Topics  . Alcohol use: Never    Frequency: Never  . Drug use: Never     Allergies   Aspirin; Cephalexin; Morphine and related; Sulfa antibiotics; Ciprofloxacin; Sulfamethoxazole-trimethoprim; Corticosteroids; Nsaids; Prednisone; and Tape   Review of Systems Review of Systems  Constitutional: Negative for activity change, appetite change, chills, fatigue and fever.  HENT: Negative for congestion, ear pain, rhinorrhea, sinus pressure, sore throat and trouble swallowing.   Eyes: Negative for discharge and redness.  Respiratory: Positive for cough. Negative for chest tightness and shortness of breath.   Cardiovascular: Negative for chest pain.  Gastrointestinal: Positive for abdominal pain, diarrhea, nausea and vomiting.  Musculoskeletal: Negative for myalgias.  Skin: Negative for rash.  Neurological: Negative for dizziness, light-headedness and headaches.     Physical Exam Triage Vital Signs ED Triage Vitals  Enc Vitals Group  BP 04/22/18 1339 108/65     Pulse Rate 04/22/18 1339 (!) 108     Resp 04/22/18 1339 18     Temp 04/22/18 1339 99.6 F (37.6 C)     Temp Source 04/22/18 1339 Oral     SpO2 04/22/18 1339 98 %     Weight --      Height --      Head Circumference --      Peak Flow --      Pain Score 04/22/18 1332 8     Pain Loc --      Pain Edu?  --      Excl. in GC? --    No data found.  Updated Vital Signs BP 108/65 (BP Location: Right Arm)   Pulse (!) 108   Temp 99.6 F (37.6 C) (Oral)   Resp 18   SpO2 98%   Visual Acuity Right Eye Distance:   Left Eye Distance:   Bilateral Distance:    Right Eye Near:   Left Eye Near:    Bilateral Near:     Physical Exam Vitals signs and nursing note reviewed.  Constitutional:      General: She is not in acute distress.    Appearance: She is well-developed.     Comments: thin  HENT:     Head: Normocephalic and atraumatic.     Ears:     Comments: Bilateral ears without tenderness to palpation of external auricle, tragus and mastoid, EAC's without erythema or swelling, TM's with good bony landmarks and cone of light. Non erythematous.    Mouth/Throat:     Comments: Oral mucosa pink and moist, no tonsillar enlargement or exudate. Posterior pharynx patent and nonerythematous, no uvula deviation or swelling. Normal phonation. Eyes:     Conjunctiva/sclera: Conjunctivae normal.  Neck:     Musculoskeletal: Neck supple.  Cardiovascular:     Rate and Rhythm: Regular rhythm. Tachycardia present.     Heart sounds: No murmur.  Pulmonary:     Effort: Pulmonary effort is normal. No respiratory distress.     Breath sounds: Normal breath sounds.     Comments: Breathing comfortably at rest, CTABL, no wheezing, rales or other adventitious sounds auscultated Abdominal:     Palpations: Abdomen is soft.     Tenderness: There is abdominal tenderness.     Comments: Abdomen soft, nondistended, tender to palpation throughout abdomen, no focal tenderness, negative rebound, negative Rovsing, negative McBurney's.  Skin:    General: Skin is warm and dry.  Neurological:     Mental Status: She is alert.      UC Treatments / Results  Labs (all labs ordered are listed, but only abnormal results are displayed) Labs Reviewed - No data to display  EKG None  Radiology No results  found.  Procedures Procedures (including critical care time)  Medications Ordered in UC Medications - No data to display  Initial Impression / Assessment and Plan / UC Course  I have reviewed the triage vital signs and the nursing notes.  Pertinent labs & imaging results that were available during my care of the patient were reviewed by me and considered in my medical decision making (see chart for details).     Patient with acute onset of nausea vomiting diarrhea, low-grade fever, tachycardic.  Most likely viral illness, abdominal exam nonfocal, negative peritoneal signs.  Will recommend symptomatic and supportive care at this time, Zofran to control nausea and vomiting.  Oral rehydration, push fluids.  Continue  to monitor for gradual resolution of symptoms.  Will provide brompheniramine cough syrup to use as needed for cough.  Continue to monitor for continued resolution of cough.  Patient recently on doxycycline which would cover any atypical respiratory illness in lungs.  No wheezing auscultated, will hold off on any prednisone.  Patient is managed by pain clinic, will avoid any cough syrups with controlled substances.  Discussed strict return precautions. Patient verbalized understanding and is agreeable with plan.  Final Clinical Impressions(s) / UC Diagnoses   Final diagnoses:  Nausea vomiting and diarrhea  Cough     Discharge Instructions     Your nausea, vomiting, and diarrhea appear to have a viral cause. Your symptoms should improve over the next week as your body continues to rid the infectious cause.  For nausea: Zofran prescribed. Begin with every 6 hours, than as you are able to hold food down, take it as needed. Start with clear liquids, then move to plain foods like bananas, rice, applesauce, toast, broth, grits, oatmeal. As those food settle okay you may transition to your normal foods. Avoid spicy and greasy foods as much as possible.  For Diarrhea: This is your  body's natural way of getting rid of a virus. You may try taking 1 imodium to decrease amount of stools a day, but we do not want you to stop your diarrhea.   Preventing dehydration is key! You need to replace the fluid your body is expelling. Drink plenty of fluids, may use Pedialyte or sports drinks.   Please return if you are experiencing blood in your vomit or stool or experiencing dizziness, lightheadedness, extreme fatigue, increased abdominal pain.   Cough May use cough syrup every 8 hours as needed for cough   ED Prescriptions    Medication Sig Dispense Auth. Provider   ondansetron (ZOFRAN ODT) 4 MG disintegrating tablet Take 1 tablet (4 mg total) by mouth every 8 (eight) hours as needed for nausea or vomiting. 20 tablet Elizjah Noblet C, PA-C   brompheniramine-pseudoephedrine-DM 30-2-10 MG/5ML syrup Take 5 mLs by mouth 4 (four) times daily as needed. 120 mL Krisanne Lich C, PA-C     Controlled Substance Prescriptions [place] Controlled Substance Registry consulted? Not Applicable   Lew DawesWieters, Jennier Schissler C, [place]PA-C 04/22/18 1419

## 2018-04-22 NOTE — ED Triage Notes (Signed)
[facility]/D that started yesterday.

## 2018-04-22 NOTE — Discharge Instructions (Signed)
Your nausea, vomiting, and diarrhea appear to have a viral cause. Your symptoms should improve over the next week as your body continues to rid the infectious cause.  For nausea: Zofran prescribed. Begin with every 6 hours, than as you are able to hold food down, take it as needed. Start with clear liquids, then move to plain foods like bananas, rice, applesauce, toast, broth, grits, oatmeal. As those food settle okay you may transition to your normal foods. Avoid spicy and greasy foods as much as possible.  For Diarrhea: This is your body's natural way of getting rid of a virus. You may try taking 1 imodium to decrease amount of stools a day, but we do not want you to stop your diarrhea.   Preventing dehydration is key! You need to replace the fluid your body is expelling. Drink plenty of fluids, may use Pedialyte or sports drinks.   Please return if you are experiencing blood in your vomit or stool or experiencing dizziness, lightheadedness, extreme fatigue, increased abdominal pain.   Cough May use cough syrup every 8 hours as needed for cough

## 2018-05-08 DIAGNOSIS — M791 Myalgia, unspecified site: Secondary | ICD-10-CM | POA: Diagnosis not present

## 2018-05-08 DIAGNOSIS — M329 Systemic lupus erythematosus, unspecified: Secondary | ICD-10-CM | POA: Diagnosis not present

## 2018-05-08 DIAGNOSIS — G90512 Complex regional pain syndrome I of left upper limb: Secondary | ICD-10-CM | POA: Diagnosis not present

## 2018-05-08 DIAGNOSIS — M542 Cervicalgia: Secondary | ICD-10-CM | POA: Diagnosis not present

## 2018-05-08 DIAGNOSIS — Z79891 Long term (current) use of opiate analgesic: Secondary | ICD-10-CM | POA: Diagnosis not present

## 2018-05-08 DIAGNOSIS — G894 Chronic pain syndrome: Secondary | ICD-10-CM | POA: Diagnosis not present

## 2018-05-08 DIAGNOSIS — M545 Low back pain: Secondary | ICD-10-CM | POA: Diagnosis not present

## 2018-05-08 DIAGNOSIS — M159 Polyosteoarthritis, unspecified: Secondary | ICD-10-CM | POA: Diagnosis not present

## 2018-05-11 DIAGNOSIS — R911 Solitary pulmonary nodule: Secondary | ICD-10-CM | POA: Diagnosis not present

## 2018-05-11 DIAGNOSIS — F17210 Nicotine dependence, cigarettes, uncomplicated: Secondary | ICD-10-CM | POA: Diagnosis not present

## 2018-05-11 DIAGNOSIS — J439 Emphysema, unspecified: Secondary | ICD-10-CM | POA: Diagnosis not present

## 2018-06-06 DIAGNOSIS — M329 Systemic lupus erythematosus, unspecified: Secondary | ICD-10-CM | POA: Diagnosis not present

## 2018-06-06 DIAGNOSIS — M545 Low back pain: Secondary | ICD-10-CM | POA: Diagnosis not present

## 2018-06-06 DIAGNOSIS — M791 Myalgia, unspecified site: Secondary | ICD-10-CM | POA: Diagnosis not present

## 2018-06-06 DIAGNOSIS — G894 Chronic pain syndrome: Secondary | ICD-10-CM | POA: Diagnosis not present

## 2018-06-06 DIAGNOSIS — M159 Polyosteoarthritis, unspecified: Secondary | ICD-10-CM | POA: Diagnosis not present

## 2018-06-06 DIAGNOSIS — G90512 Complex regional pain syndrome I of left upper limb: Secondary | ICD-10-CM | POA: Diagnosis not present

## 2018-06-06 DIAGNOSIS — M542 Cervicalgia: Secondary | ICD-10-CM | POA: Diagnosis not present

## 2018-07-04 DIAGNOSIS — G90512 Complex regional pain syndrome I of left upper limb: Secondary | ICD-10-CM | POA: Diagnosis not present

## 2018-07-04 DIAGNOSIS — M542 Cervicalgia: Secondary | ICD-10-CM | POA: Diagnosis not present

## 2018-07-04 DIAGNOSIS — M329 Systemic lupus erythematosus, unspecified: Secondary | ICD-10-CM | POA: Diagnosis not present

## 2018-07-04 DIAGNOSIS — M791 Myalgia, unspecified site: Secondary | ICD-10-CM | POA: Diagnosis not present

## 2018-07-04 DIAGNOSIS — M545 Low back pain: Secondary | ICD-10-CM | POA: Diagnosis not present

## 2018-07-04 DIAGNOSIS — G894 Chronic pain syndrome: Secondary | ICD-10-CM | POA: Diagnosis not present

## 2018-07-04 DIAGNOSIS — R51 Headache: Secondary | ICD-10-CM | POA: Diagnosis not present

## 2018-08-01 DIAGNOSIS — G894 Chronic pain syndrome: Secondary | ICD-10-CM | POA: Diagnosis not present

## 2018-08-01 DIAGNOSIS — M545 Low back pain: Secondary | ICD-10-CM | POA: Diagnosis not present

## 2018-08-01 DIAGNOSIS — M542 Cervicalgia: Secondary | ICD-10-CM | POA: Diagnosis not present

## 2018-08-01 DIAGNOSIS — G9050 Complex regional pain syndrome I, unspecified: Secondary | ICD-10-CM | POA: Diagnosis not present

## 2018-08-01 DIAGNOSIS — M329 Systemic lupus erythematosus, unspecified: Secondary | ICD-10-CM | POA: Diagnosis not present

## 2018-08-01 DIAGNOSIS — M159 Polyosteoarthritis, unspecified: Secondary | ICD-10-CM | POA: Diagnosis not present

## 2018-08-01 DIAGNOSIS — M791 Myalgia, unspecified site: Secondary | ICD-10-CM | POA: Diagnosis not present

## 2018-08-29 DIAGNOSIS — G9050 Complex regional pain syndrome I, unspecified: Secondary | ICD-10-CM | POA: Diagnosis not present

## 2018-08-29 DIAGNOSIS — M159 Polyosteoarthritis, unspecified: Secondary | ICD-10-CM | POA: Diagnosis not present

## 2018-08-29 DIAGNOSIS — G90512 Complex regional pain syndrome I of left upper limb: Secondary | ICD-10-CM | POA: Diagnosis not present

## 2018-08-29 DIAGNOSIS — M545 Low back pain: Secondary | ICD-10-CM | POA: Diagnosis not present

## 2018-08-29 DIAGNOSIS — M329 Systemic lupus erythematosus, unspecified: Secondary | ICD-10-CM | POA: Diagnosis not present

## 2018-08-29 DIAGNOSIS — M542 Cervicalgia: Secondary | ICD-10-CM | POA: Diagnosis not present

## 2018-08-29 DIAGNOSIS — G894 Chronic pain syndrome: Secondary | ICD-10-CM | POA: Diagnosis not present

## 2018-08-29 DIAGNOSIS — M791 Myalgia, unspecified site: Secondary | ICD-10-CM | POA: Diagnosis not present

## 2018-09-28 DIAGNOSIS — M791 Myalgia, unspecified site: Secondary | ICD-10-CM | POA: Diagnosis not present

## 2018-09-28 DIAGNOSIS — Z79891 Long term (current) use of opiate analgesic: Secondary | ICD-10-CM | POA: Diagnosis not present

## 2018-09-28 DIAGNOSIS — G90512 Complex regional pain syndrome I of left upper limb: Secondary | ICD-10-CM | POA: Diagnosis not present

## 2018-09-28 DIAGNOSIS — M159 Polyosteoarthritis, unspecified: Secondary | ICD-10-CM | POA: Diagnosis not present

## 2018-09-28 DIAGNOSIS — M25512 Pain in left shoulder: Secondary | ICD-10-CM | POA: Diagnosis not present

## 2018-09-28 DIAGNOSIS — M25511 Pain in right shoulder: Secondary | ICD-10-CM | POA: Diagnosis not present

## 2018-09-28 DIAGNOSIS — G894 Chronic pain syndrome: Secondary | ICD-10-CM | POA: Diagnosis not present

## 2018-09-28 DIAGNOSIS — M329 Systemic lupus erythematosus, unspecified: Secondary | ICD-10-CM | POA: Diagnosis not present

## 2018-09-28 DIAGNOSIS — M542 Cervicalgia: Secondary | ICD-10-CM | POA: Diagnosis not present

## 2018-09-28 DIAGNOSIS — G9050 Complex regional pain syndrome I, unspecified: Secondary | ICD-10-CM | POA: Diagnosis not present

## 2018-10-13 ENCOUNTER — Other Ambulatory Visit: Payer: Self-pay | Admitting: Physician Assistant

## 2018-10-13 DIAGNOSIS — M542 Cervicalgia: Secondary | ICD-10-CM

## 2018-10-26 DIAGNOSIS — M159 Polyosteoarthritis, unspecified: Secondary | ICD-10-CM | POA: Diagnosis not present

## 2018-10-26 DIAGNOSIS — G9050 Complex regional pain syndrome I, unspecified: Secondary | ICD-10-CM | POA: Diagnosis not present

## 2018-10-26 DIAGNOSIS — G894 Chronic pain syndrome: Secondary | ICD-10-CM | POA: Diagnosis not present

## 2018-10-26 DIAGNOSIS — M542 Cervicalgia: Secondary | ICD-10-CM | POA: Diagnosis not present

## 2018-10-26 DIAGNOSIS — M329 Systemic lupus erythematosus, unspecified: Secondary | ICD-10-CM | POA: Diagnosis not present

## 2018-10-26 DIAGNOSIS — M791 Myalgia, unspecified site: Secondary | ICD-10-CM | POA: Diagnosis not present

## 2018-10-26 DIAGNOSIS — G90512 Complex regional pain syndrome I of left upper limb: Secondary | ICD-10-CM | POA: Diagnosis not present

## 2018-11-01 ENCOUNTER — Ambulatory Visit
Admission: RE | Admit: 2018-11-01 | Discharge: 2018-11-01 | Disposition: A | Discharge status: Home / Self Care | Payer: PPO | Source: Ambulatory Visit | Attending: Physician Assistant | Admitting: Physician Assistant

## 2018-11-01 DIAGNOSIS — M47812 Spondylosis without myelopathy or radiculopathy, cervical region: Secondary | ICD-10-CM | POA: Diagnosis not present

## 2018-11-01 DIAGNOSIS — M50221 Other cervical disc displacement at C4-C5 level: Secondary | ICD-10-CM | POA: Diagnosis not present

## 2018-11-01 DIAGNOSIS — M542 Cervicalgia: Secondary | ICD-10-CM

## 2018-11-01 MED ORDER — GADOBENATE DIMEGLUMINE 529 MG/ML IV SOLN
10.0000 mL | Freq: Once | INTRAVENOUS | Status: AC | PRN
  Administered 2018-11-01: 10 mL via INTRAVENOUS

## 2018-11-30 DIAGNOSIS — R200 Anesthesia of skin: Secondary | ICD-10-CM | POA: Diagnosis not present

## 2018-11-30 DIAGNOSIS — M25511 Pain in right shoulder: Secondary | ICD-10-CM | POA: Diagnosis not present

## 2018-11-30 DIAGNOSIS — M791 Myalgia, unspecified site: Secondary | ICD-10-CM | POA: Diagnosis not present

## 2018-11-30 DIAGNOSIS — M79601 Pain in right arm: Secondary | ICD-10-CM | POA: Diagnosis not present

## 2018-11-30 DIAGNOSIS — M542 Cervicalgia: Secondary | ICD-10-CM | POA: Diagnosis not present

## 2018-11-30 DIAGNOSIS — M545 Low back pain: Secondary | ICD-10-CM | POA: Diagnosis not present

## 2018-11-30 DIAGNOSIS — G894 Chronic pain syndrome: Secondary | ICD-10-CM | POA: Diagnosis not present

## 2018-12-19 DIAGNOSIS — M542 Cervicalgia: Secondary | ICD-10-CM | POA: Diagnosis not present

## 2018-12-28 DIAGNOSIS — M545 Low back pain: Secondary | ICD-10-CM | POA: Diagnosis not present

## 2018-12-28 DIAGNOSIS — M329 Systemic lupus erythematosus, unspecified: Secondary | ICD-10-CM | POA: Diagnosis not present

## 2018-12-28 DIAGNOSIS — Z79891 Long term (current) use of opiate analgesic: Secondary | ICD-10-CM | POA: Diagnosis not present

## 2018-12-28 DIAGNOSIS — M542 Cervicalgia: Secondary | ICD-10-CM | POA: Diagnosis not present

## 2018-12-28 DIAGNOSIS — G9050 Complex regional pain syndrome I, unspecified: Secondary | ICD-10-CM | POA: Diagnosis not present

## 2018-12-28 DIAGNOSIS — M159 Polyosteoarthritis, unspecified: Secondary | ICD-10-CM | POA: Diagnosis not present

## 2019-01-11 DIAGNOSIS — N882 Stricture and stenosis of cervix uteri: Secondary | ICD-10-CM | POA: Diagnosis not present

## 2019-01-11 DIAGNOSIS — R030 Elevated blood-pressure reading, without diagnosis of hypertension: Secondary | ICD-10-CM | POA: Diagnosis not present

## 2019-01-11 DIAGNOSIS — M5412 Radiculopathy, cervical region: Secondary | ICD-10-CM | POA: Diagnosis not present

## 2019-01-12 ENCOUNTER — Other Ambulatory Visit: Payer: Self-pay | Admitting: Physical Medicine and Rehabilitation

## 2019-01-12 DIAGNOSIS — N882 Stricture and stenosis of cervix uteri: Secondary | ICD-10-CM

## 2019-01-25 DIAGNOSIS — Z720 Tobacco use: Secondary | ICD-10-CM | POA: Diagnosis not present

## 2019-01-25 DIAGNOSIS — G9050 Complex regional pain syndrome I, unspecified: Secondary | ICD-10-CM | POA: Diagnosis not present

## 2019-01-25 DIAGNOSIS — M542 Cervicalgia: Secondary | ICD-10-CM | POA: Diagnosis not present

## 2019-01-25 DIAGNOSIS — M159 Polyosteoarthritis, unspecified: Secondary | ICD-10-CM | POA: Diagnosis not present

## 2019-01-25 DIAGNOSIS — M329 Systemic lupus erythematosus, unspecified: Secondary | ICD-10-CM | POA: Diagnosis not present

## 2019-01-25 DIAGNOSIS — M545 Low back pain: Secondary | ICD-10-CM | POA: Diagnosis not present

## 2019-01-25 DIAGNOSIS — M25511 Pain in right shoulder: Secondary | ICD-10-CM | POA: Diagnosis not present

## 2019-01-29 DIAGNOSIS — Z716 Tobacco abuse counseling: Secondary | ICD-10-CM | POA: Diagnosis not present

## 2019-01-29 DIAGNOSIS — M5412 Radiculopathy, cervical region: Secondary | ICD-10-CM | POA: Diagnosis not present

## 2019-01-29 DIAGNOSIS — F17210 Nicotine dependence, cigarettes, uncomplicated: Secondary | ICD-10-CM | POA: Diagnosis not present

## 2019-02-15 DIAGNOSIS — M5412 Radiculopathy, cervical region: Secondary | ICD-10-CM | POA: Diagnosis not present

## 2019-02-22 DIAGNOSIS — M25511 Pain in right shoulder: Secondary | ICD-10-CM | POA: Diagnosis not present

## 2019-02-22 DIAGNOSIS — M329 Systemic lupus erythematosus, unspecified: Secondary | ICD-10-CM | POA: Diagnosis not present

## 2019-02-22 DIAGNOSIS — Z720 Tobacco use: Secondary | ICD-10-CM | POA: Diagnosis not present

## 2019-02-22 DIAGNOSIS — M545 Low back pain: Secondary | ICD-10-CM | POA: Diagnosis not present

## 2019-02-22 DIAGNOSIS — M542 Cervicalgia: Secondary | ICD-10-CM | POA: Diagnosis not present

## 2019-02-22 DIAGNOSIS — G9050 Complex regional pain syndrome I, unspecified: Secondary | ICD-10-CM | POA: Diagnosis not present

## 2019-02-22 DIAGNOSIS — M159 Polyosteoarthritis, unspecified: Secondary | ICD-10-CM | POA: Diagnosis not present

## 2019-04-04 DIAGNOSIS — R0602 Shortness of breath: Secondary | ICD-10-CM | POA: Diagnosis not present

## 2019-04-04 DIAGNOSIS — J439 Emphysema, unspecified: Secondary | ICD-10-CM | POA: Diagnosis not present

## 2019-04-04 DIAGNOSIS — F17200 Nicotine dependence, unspecified, uncomplicated: Secondary | ICD-10-CM | POA: Diagnosis not present

## 2019-04-04 DIAGNOSIS — F17218 Nicotine dependence, cigarettes, with other nicotine-induced disorders: Secondary | ICD-10-CM | POA: Diagnosis not present

## 2019-04-09 ENCOUNTER — Other Ambulatory Visit: Payer: Self-pay | Admitting: Physician Assistant

## 2019-04-09 DIAGNOSIS — M329 Systemic lupus erythematosus, unspecified: Secondary | ICD-10-CM | POA: Diagnosis not present

## 2019-04-09 DIAGNOSIS — G894 Chronic pain syndrome: Secondary | ICD-10-CM | POA: Diagnosis not present

## 2019-04-09 DIAGNOSIS — K219 Gastro-esophageal reflux disease without esophagitis: Secondary | ICD-10-CM | POA: Diagnosis not present

## 2019-04-09 DIAGNOSIS — J439 Emphysema, unspecified: Secondary | ICD-10-CM | POA: Diagnosis not present

## 2019-04-09 DIAGNOSIS — M5136 Other intervertebral disc degeneration, lumbar region: Secondary | ICD-10-CM | POA: Diagnosis not present

## 2019-04-09 DIAGNOSIS — M5030 Other cervical disc degeneration, unspecified cervical region: Secondary | ICD-10-CM | POA: Diagnosis not present

## 2019-04-09 DIAGNOSIS — Z681 Body mass index (BMI) 19 or less, adult: Secondary | ICD-10-CM | POA: Diagnosis not present

## 2019-04-09 DIAGNOSIS — E785 Hyperlipidemia, unspecified: Secondary | ICD-10-CM | POA: Diagnosis not present

## 2019-04-09 DIAGNOSIS — Z1239 Encounter for other screening for malignant neoplasm of breast: Secondary | ICD-10-CM | POA: Diagnosis not present

## 2019-04-09 DIAGNOSIS — G9050 Complex regional pain syndrome I, unspecified: Secondary | ICD-10-CM | POA: Diagnosis not present

## 2019-04-09 DIAGNOSIS — F17200 Nicotine dependence, unspecified, uncomplicated: Secondary | ICD-10-CM | POA: Diagnosis not present

## 2019-04-09 DIAGNOSIS — N183 Chronic kidney disease, stage 3 unspecified: Secondary | ICD-10-CM | POA: Diagnosis not present

## 2019-04-09 DIAGNOSIS — Z1231 Encounter for screening mammogram for malignant neoplasm of breast: Secondary | ICD-10-CM

## 2019-04-16 DIAGNOSIS — F17218 Nicotine dependence, cigarettes, with other nicotine-induced disorders: Secondary | ICD-10-CM | POA: Diagnosis not present

## 2019-04-16 DIAGNOSIS — J432 Centrilobular emphysema: Secondary | ICD-10-CM | POA: Diagnosis not present

## 2019-04-16 DIAGNOSIS — Z122 Encounter for screening for malignant neoplasm of respiratory organs: Secondary | ICD-10-CM | POA: Diagnosis not present

## 2019-04-16 DIAGNOSIS — Z87891 Personal history of nicotine dependence: Secondary | ICD-10-CM | POA: Diagnosis not present

## 2019-05-14 DIAGNOSIS — R911 Solitary pulmonary nodule: Secondary | ICD-10-CM | POA: Diagnosis not present

## 2019-05-14 DIAGNOSIS — E559 Vitamin D deficiency, unspecified: Secondary | ICD-10-CM | POA: Diagnosis not present

## 2019-05-14 DIAGNOSIS — E210 Primary hyperparathyroidism: Secondary | ICD-10-CM | POA: Diagnosis not present

## 2019-05-14 DIAGNOSIS — N182 Chronic kidney disease, stage 2 (mild): Secondary | ICD-10-CM | POA: Diagnosis not present

## 2019-05-30 ENCOUNTER — Ambulatory Visit: Payer: PPO

## 2019-06-11 ENCOUNTER — Other Ambulatory Visit: Payer: Self-pay

## 2019-06-11 ENCOUNTER — Ambulatory Visit
Admission: RE | Admit: 2019-06-11 | Discharge: 2019-06-11 | Disposition: A | Discharge status: Home / Self Care | Payer: PPO | Source: Ambulatory Visit | Attending: Physician Assistant | Admitting: Physician Assistant

## 2019-06-11 DIAGNOSIS — Z1231 Encounter for screening mammogram for malignant neoplasm of breast: Secondary | ICD-10-CM

## 2019-07-11 DIAGNOSIS — M5136 Other intervertebral disc degeneration, lumbar region: Secondary | ICD-10-CM | POA: Diagnosis not present

## 2019-07-11 DIAGNOSIS — G894 Chronic pain syndrome: Secondary | ICD-10-CM | POA: Diagnosis not present

## 2019-07-11 DIAGNOSIS — G9050 Complex regional pain syndrome I, unspecified: Secondary | ICD-10-CM | POA: Diagnosis not present

## 2019-07-11 DIAGNOSIS — M329 Systemic lupus erythematosus, unspecified: Secondary | ICD-10-CM | POA: Diagnosis not present

## 2019-07-11 DIAGNOSIS — M5030 Other cervical disc degeneration, unspecified cervical region: Secondary | ICD-10-CM | POA: Diagnosis not present

## 2019-07-11 DIAGNOSIS — Z681 Body mass index (BMI) 19 or less, adult: Secondary | ICD-10-CM | POA: Diagnosis not present

## 2019-07-11 DIAGNOSIS — K219 Gastro-esophageal reflux disease without esophagitis: Secondary | ICD-10-CM | POA: Diagnosis not present

## 2019-07-11 DIAGNOSIS — E785 Hyperlipidemia, unspecified: Secondary | ICD-10-CM | POA: Diagnosis not present

## 2019-07-11 DIAGNOSIS — J439 Emphysema, unspecified: Secondary | ICD-10-CM | POA: Diagnosis not present

## 2019-07-11 DIAGNOSIS — M533 Sacrococcygeal disorders, not elsewhere classified: Secondary | ICD-10-CM | POA: Diagnosis not present

## 2019-07-11 DIAGNOSIS — N183 Chronic kidney disease, stage 3 unspecified: Secondary | ICD-10-CM | POA: Diagnosis not present

## 2019-07-11 DIAGNOSIS — F17200 Nicotine dependence, unspecified, uncomplicated: Secondary | ICD-10-CM | POA: Diagnosis not present

## 2019-08-24 ENCOUNTER — Ambulatory Visit (HOSPITAL_COMMUNITY)
Admission: EM | Admit: 2019-08-24 | Discharge: 2019-08-24 | Disposition: A | Discharge status: Home / Self Care | Payer: PPO | Attending: Family Medicine | Admitting: Family Medicine

## 2019-08-24 ENCOUNTER — Other Ambulatory Visit: Payer: Self-pay

## 2019-08-24 ENCOUNTER — Encounter (HOSPITAL_COMMUNITY): Payer: Self-pay

## 2019-08-24 DIAGNOSIS — K047 Periapical abscess without sinus: Secondary | ICD-10-CM

## 2019-08-24 HISTORY — DX: Other cervical disc displacement, unspecified cervical region: M50.20

## 2019-08-24 HISTORY — DX: Reserved for concepts with insufficient information to code with codable children: IMO0002

## 2019-08-24 HISTORY — DX: Systemic lupus erythematosus, unspecified: M32.9

## 2019-08-24 MED ORDER — CLINDAMYCIN HCL 300 MG PO CAPS: 300.0000 mg | ORAL_CAPSULE | Freq: Three times a day (TID) | ORAL | 0 refills | Status: AC

## 2019-08-24 NOTE — ED Triage Notes (Signed)
Pt c/o dental abscess on lower gumsx2 wks. Pt states she's tried to find dentists to take her teeth out w/o ins, but hasn't been able to.

## 2019-08-24 NOTE — ED Provider Notes (Signed)
[facility]    CSN: [id number] Arrival date & time: 08/24/19  1536      History   Chief Complaint Chief Complaint  Patient presents with   dental abscess    HPI Teresa George is a 59 y.o. female presenting today for evaluation of dental infection.  Patient notes over the past 2 weeks she has had increased pain swelling and redness to her lower jaw.  She has developed some redness over her chin of recently.  She has been mainly eating soft foods and has been unable to wear her partial dentures.  She denies fevers.  Does not have dental insurance and has been unable to get in with dentistry for removal of these teeth.  She denies difficulty swallowing.  Does have neck stiffness, but reports this is normal for her as she has 4 herniated disks in her neck.  Denies worsening stiffness than normal.  HPI  Past Medical History:  Diagnosis Date   Herniated disc, cervical    Lupus (HCC)    Reflex sympathetic dystrophy    Renal disorder     There are no problems to display for this patient.   Past Surgical History:  Procedure Laterality Date   ABDOMINAL HYSTERECTOMY     ADENOIDECTOMY     CARPAL TUNNEL RELEASE Left    CHOLECYSTECTOMY     LAPAROSCOPIC LUMBAR SYMPATHECTOMY     medial epicondylectomy     NEUROLYSIS MEDIAN NERVE Left    pyloric obstruction repair     subcutaneous transposition     submuscular transposition     TONSILLECTOMY      OB History   No obstetric history on file.      Home Medications    Prior to Admission medications   Medication Sig Start Date End Date Taking? Authorizing Provider  ergocalciferol (VITAMIN D2) 1.25 MG (50000 UT) capsule Take 50,000 Units by mouth once a week.   Yes [provider]  Tiotropium Bromide-Olodaterol (STIOLTO RESPIMAT) 2.5-2.5 MCG/ACT AERS Inhale into the lungs.   Yes [provider]  tiZANidine (ZANAFLEX) 4 MG capsule Take 4 mg by mouth 3 (three) times daily.   Yes  [provider]  albuterol (PROVENTIL HFA;VENTOLIN HFA) 108 (90 Base) MCG/ACT inhaler Inhale into the lungs. 10/03/14   [provider]  clindamycin (CLEOCIN) 300 MG capsule Take 1 capsule (300 mg total) by mouth 3 (three) times daily for 7 days. 08/24/19 08/31/19  Mykiah Schmuck C, PA-C  ondansetron (ZOFRAN ODT) 4 MG disintegrating tablet Take 1 tablet (4 mg total) by mouth every 8 (eight) hours as needed for nausea or vomiting. 04/22/18   Samael Blades C, PA-C  oxycodone (ROXICODONE) 30 MG immediate release tablet Take 15 mg by mouth.  03/26/14   [provider]  fluticasone (FLONASE) 50 MCG/ACT nasal spray 1 spray by Each Nare route daily. 04/11/18 08/24/19  [provider]    Family History Family History  Problem Relation Age of Onset   Breast cancer Maternal Aunt     Social History Social History   Tobacco Use   Smoking status: Current Every Day Smoker    Packs/day: 0.50    Types: Cigarettes   Smokeless tobacco: Never Used  Substance Use Topics   Alcohol use: Never   Drug use: Never     Allergies   Aspirin, Cephalexin, Morphine and related, Sulfa antibiotics, Ciprofloxacin, Sulfamethoxazole-trimethoprim, Corticosteroids, Nsaids, Prednisone, and Tape   Review of Systems Review of Systems  Constitutional: Negative for  activity change, appetite change, chills, fatigue and fever.  HENT: Positive for dental problem. Negative for congestion, ear pain, rhinorrhea, sinus pressure, sore throat and trouble swallowing.   Eyes: Negative for discharge and redness.  Respiratory: Negative for cough, chest tightness and shortness of breath.   Cardiovascular: Negative for chest pain.  Gastrointestinal: Negative for abdominal pain, diarrhea, nausea and vomiting.  Musculoskeletal: Negative for myalgias.  Skin: Negative for rash.  Neurological: Negative for dizziness, light-headedness and headaches.     Physical Exam Triage Vital Signs ED Triage  Vitals  Enc Vitals Group     BP 08/24/19 1619 136/63     Pulse Rate 08/24/19 1619 95     Resp 08/24/19 1619 16     Temp 08/24/19 1619 99.1 F (37.3 C)     Temp Source 08/24/19 1619 Oral     SpO2 08/24/19 1619 95 %     Weight 08/24/19 1620 110 lb (49.9 kg)     Height 08/24/19 1620 5\' 8"  (1.727 m)     Head Circumference --      Peak Flow --      Pain Score 08/24/19 1619 7     Pain Loc --      Pain Edu? --      Excl. in GC? --    No data found.  Updated Vital Signs BP 136/63    Pulse 95    Temp 99.1 F (37.3 C) (Oral)    Resp 16    Ht 5\' 8"  (1.727 m)    Wt 110 lb (49.9 kg)    SpO2 95%    BMI 16.73 kg/m   Visual Acuity Right Eye Distance:   Left Eye Distance:   Bilateral Distance:    Right Eye Near:   Left Eye Near:    Bilateral Near:     Physical Exam Vitals and nursing note reviewed.  Constitutional:      Appearance: She is well-developed.     Comments: No acute distress  HENT:     Head: Normocephalic and atraumatic.     Comments: Redness noted to anterior chin, no redness or swelling extending below mandibular line    Nose: Nose normal.     Mouth/Throat:     Comments: Poor dentition, teeth largely missing, 4 teeth to central lower jaw appear decaying with surrounding erythematous and tender gingiva; nontender to palpation of soft palate below tongue Eyes:     Conjunctiva/sclera: Conjunctivae normal.  Neck:     Comments: No obvious neck swelling or redness, no lymphadenopathy Cardiovascular:     Rate and Rhythm: Normal rate.  Pulmonary:     Effort: Pulmonary effort is normal. No respiratory distress.  Abdominal:     General: There is no distension.  Musculoskeletal:        General: Normal range of motion.     Cervical back: Neck supple.  Skin:    General: Skin is warm and dry.  Neurological:     Mental Status: She is alert and oriented to person, place, and time.      UC Treatments / Results  Labs (all labs ordered are listed, but only abnormal  results are displayed) Labs Reviewed - No data to display  EKG   Radiology No results found.  Procedures Procedures (including critical care time)  Medications Ordered in UC Medications - No data to display  Initial Impression / Assessment and Plan / UC Course  I have reviewed the triage vital signs and the nursing  notes.  Pertinent labs & imaging results that were available during my care of the patient were reviewed by me and considered in my medical decision making (see chart for details).    Exam suggestive of likely dental infection, initiating on clindamycin 300 mg 3 times daily x1 week.  Patient has chronic pain management, continue with regular prescribed pain medicine.  No sign of Ludwig's angina at this time, continue to monitor for gradual improvement with oral antibiotics, follow-up if symptoms progressing or worsening.  Provided dental resources.  Discussed strict return precautions. Patient verbalized understanding and is agreeable with plan.   Final Clinical Impressions(s) / UC Diagnoses   Final diagnoses:  Dental infection     Discharge Instructions     Please use dental resource to contact offices to seek permenant treatment/relief.   Today we have given you an antibiotic- clindamycin- every 8 hours x 1 week. This should help with pain as any infection is cleared.   For pain please take 600mg -800mg  of Ibuprofen every 8 hours, take with 1000 mg of Tylenol Extra strength every 8 hours. These are safe to take together. Please take with food.   Please return if you start to experience significant swelling of your face, experiencing fever.   ED Prescriptions    Medication Sig Dispense Auth. Provider   clindamycin (CLEOCIN) 300 MG capsule Take 1 capsule (300 mg total) by mouth 3 (three) times daily for 7 days. 21 capsule Zenobia Kuennen, [place] C, PA-C     PDMP not reviewed this encounter.   Janith Lima, PA-C 08/24/19 1650

## 2019-08-24 NOTE — Discharge Instructions (Signed)
Please use dental resource to contact offices to seek permenant treatment/relief.   Today we have given you an antibiotic- clindamycin- every 8 hours x 1 week. This should help with pain as any infection is cleared.   For pain please take 600mg -800mg  of Ibuprofen every 8 hours, take with 1000 mg of Tylenol Extra strength every 8 hours. These are safe to take together. Please take with food.   Please return if you start to experience significant swelling of your face, experiencing fever.

## 2019-08-26 ENCOUNTER — Emergency Department (HOSPITAL_COMMUNITY)
Admission: EM | Admit: 2019-08-26 | Discharge: 2019-08-27 | Disposition: A | Discharge status: Home / Self Care | Payer: PPO | Attending: Emergency Medicine | Admitting: Emergency Medicine

## 2019-08-26 ENCOUNTER — Encounter (HOSPITAL_COMMUNITY): Payer: Self-pay

## 2019-08-26 ENCOUNTER — Other Ambulatory Visit: Payer: Self-pay

## 2019-08-26 DIAGNOSIS — Z5321 Procedure and treatment not carried out due to patient leaving prior to being seen by health care provider: Secondary | ICD-10-CM | POA: Diagnosis not present

## 2019-08-26 DIAGNOSIS — K0889 Other specified disorders of teeth and supporting structures: Secondary | ICD-10-CM | POA: Insufficient documentation

## 2019-08-26 NOTE — ED Triage Notes (Signed)
Pt has concerns that antibiotic is not working.

## 2019-08-26 NOTE — ED Triage Notes (Signed)
Onset 2 days ago seen at u/c for lower dental issues, was prescribed Clindamycin.  Has been on antibiotics x 48 hours.  Onset 2 days ago slight redness and swelling to chin that is worsening.

## 2019-08-26 NOTE — ED Notes (Signed)
Pt LWBS. Pt stated she would go to urgent care in the morning.

## 2019-08-27 ENCOUNTER — Other Ambulatory Visit: Payer: Self-pay

## 2019-08-27 ENCOUNTER — Encounter (HOSPITAL_COMMUNITY): Payer: Self-pay

## 2019-08-27 ENCOUNTER — Ambulatory Visit (HOSPITAL_COMMUNITY)
Admission: EM | Admit: 2019-08-27 | Discharge: 2019-08-27 | Disposition: A | Discharge status: Home / Self Care | Payer: PPO | Source: Home / Self Care

## 2019-08-27 DIAGNOSIS — L03211 Cellulitis of face: Secondary | ICD-10-CM

## 2019-08-27 DIAGNOSIS — K047 Periapical abscess without sinus: Secondary | ICD-10-CM

## 2019-08-27 DIAGNOSIS — R519 Headache, unspecified: Secondary | ICD-10-CM

## 2019-08-27 MED ORDER — DOXYCYCLINE HYCLATE 100 MG PO CAPS: 100.0000 mg | ORAL_CAPSULE | Freq: Two times a day (BID) | ORAL | 0 refills | Status: DC

## 2019-08-27 NOTE — ED Triage Notes (Signed)
Pt c/o cellulitis on chinx10 days.

## 2019-08-27 NOTE — ED Provider Notes (Signed)
[place]   MRN: [id number] DOB: April 12, 1961  Subjective:   Teresa George is a 59 y.o. female presenting for 10 day hx of worsening redness and pain over her chin. Patient was recently started on clindamycin for dental infection and is still taking this medication. She has extensive allergies and has CKD (not on dialysis). Takes oxycodone for chronic pain.   No current facility-administered medications for this encounter.  Current Outpatient Medications:  .  albuterol (PROVENTIL HFA;VENTOLIN HFA) 108 (90 Base) MCG/ACT inhaler, Inhale into the lungs., Disp: , Rfl:  .  clindamycin (CLEOCIN) 300 MG capsule, Take 1 capsule (300 mg total) by mouth 3 (three) times daily for 7 days., Disp: 21 capsule, Rfl: 0 .  ergocalciferol (VITAMIN D2) 1.25 MG (50000 UT) capsule, Take 50,000 Units by mouth once a week., Disp: , Rfl:  .  ondansetron (ZOFRAN ODT) 4 MG disintegrating tablet, Take 1 tablet (4 mg total) by mouth every 8 (eight) hours as needed for nausea or vomiting., Disp: 20 tablet, Rfl: 0 .  oxycodone (ROXICODONE) 30 MG immediate release tablet, Take 15 mg by mouth. , Disp: , Rfl:  .  Tiotropium Bromide-Olodaterol (STIOLTO RESPIMAT) 2.5-2.5 MCG/ACT AERS, Inhale into the lungs., Disp: , Rfl:  .  tiZANidine (ZANAFLEX) 4 MG capsule, Take 4 mg by mouth 3 (three) times daily., Disp: , Rfl:    Allergies  Allergen Reactions  . Aspirin Anaphylaxis    Due to kidney disease Due to kidney disease   . Cephalexin Anaphylaxis and Rash    Flare up of relex sympathetic dystrophy Flare up of relex sympathetic dystrophy Affected RSD Flare up of relex sympathetic dystrophy   . Morphine And Related Itching  . Sulfa Antibiotics Hives    Due to kidney disease   . Ciprofloxacin     Other reaction(s): Unknown RSD RSD   . Sulfamethoxazole-Trimethoprim Hives    Due to kidney disease Due to kidney disease   . Corticosteroids   . Nsaids   . Prednisone   . Tape Rash    Past Medical  History:  Diagnosis Date  . Herniated disc, cervical   . Lupus ([place])   . Reflex sympathetic dystrophy   . Renal disorder      Past Surgical History:  Procedure Laterality Date  . ABDOMINAL HYSTERECTOMY    . ADENOIDECTOMY    . CARPAL TUNNEL RELEASE Left   . CHOLECYSTECTOMY    . LAPAROSCOPIC LUMBAR SYMPATHECTOMY    . medial epicondylectomy    . NEUROLYSIS MEDIAN NERVE Left   . pyloric obstruction repair    . subcutaneous transposition    . submuscular transposition    . TONSILLECTOMY      Family History  Problem Relation Age of Onset  . Breast cancer Maternal Aunt     Social History   Tobacco Use  . Smoking status: Current Every Day Smoker    Packs/day: 0.50    Types: Cigarettes  . Smokeless tobacco: Never Used  Substance Use Topics  . Alcohol use: Never  . Drug use: Never    ROS   Objective:   Vitals: BP 127/81   Pulse (!) 57   Temp 98.2 F (36.8 C) (Oral)   Resp 16   Ht 5\' 8"  (1.727 m)   Wt 110 lb (49.9 kg)   SpO2 97%   BMI 16.73 kg/m   Physical Exam Constitutional:      General: She is not in acute distress.    Appearance: Normal appearance.  She is well-developed. She is not ill-appearing, toxic-appearing or diaphoretic.  HENT:     Head: Normocephalic and atraumatic.      Nose: Nose normal.     Mouth/Throat:     Mouth: Mucous membranes are moist.     Pharynx: Oropharynx is clear.  Eyes:     General: No scleral icterus.       Right eye: No discharge.        Left eye: No discharge.     Extraocular Movements: Extraocular movements intact.     Pupils: Pupils are equal, round, and reactive to light.  Cardiovascular:     Rate and Rhythm: Normal rate.  Pulmonary:     Effort: Pulmonary effort is normal.  Skin:    General: Skin is warm and dry.  Neurological:     General: No focal deficit present.     Mental Status: She is alert and oriented to person, place, and time.  Psychiatric:        Mood and Affect: Mood normal.        Behavior:  Behavior normal.        Thought Content: Thought content normal.        Judgment: Judgment normal.      Assessment and Plan :   PDMP not reviewed this encounter.  1. Cellulitis, face   2. Facial pain   3. Dental infection     Patient is to maintain clindamycin for dental infection. Suspect resistant bacteria due to ongoing cellulitis of her chin. Will use doxycycline for this. Maintain regular pain medications. Counseled patient on potential for adverse effects with medications prescribed/recommended today, ER and return-to-clinic precautions discussed, patient verbalized understanding.    Wallis Bamberg, [place] 08/28/19 [phone]

## 2019-08-27 NOTE — Discharge Instructions (Addendum)
[facility] [phone] extension [phone]  Dr. Civils [phone] [address]  [facility] [phone] [address]  [facility] [phone] extension [phone] [place], [place], [address] First come first serve for the first 10 clients.  May do simple extractions only, no wisdom teeth or surgery.  You may try the second for Thursday of the month starting at 6:30 AM.  [employer] You may call the school to see if they are still helping to provide dental care for emergent cases.  

## 2019-09-06 DIAGNOSIS — G9050 Complex regional pain syndrome I, unspecified: Secondary | ICD-10-CM | POA: Diagnosis not present

## 2019-09-06 DIAGNOSIS — F17200 Nicotine dependence, unspecified, uncomplicated: Secondary | ICD-10-CM | POA: Diagnosis not present

## 2019-09-06 DIAGNOSIS — J449 Chronic obstructive pulmonary disease, unspecified: Secondary | ICD-10-CM | POA: Diagnosis not present

## 2019-09-06 DIAGNOSIS — N1831 Chronic kidney disease, stage 3a: Secondary | ICD-10-CM | POA: Diagnosis not present

## 2019-09-06 DIAGNOSIS — M5136 Other intervertebral disc degeneration, lumbar region: Secondary | ICD-10-CM | POA: Diagnosis not present

## 2019-09-06 DIAGNOSIS — K219 Gastro-esophageal reflux disease without esophagitis: Secondary | ICD-10-CM | POA: Diagnosis not present

## 2019-09-06 DIAGNOSIS — E785 Hyperlipidemia, unspecified: Secondary | ICD-10-CM | POA: Diagnosis not present

## 2019-09-06 DIAGNOSIS — M533 Sacrococcygeal disorders, not elsewhere classified: Secondary | ICD-10-CM | POA: Diagnosis not present

## 2019-09-06 DIAGNOSIS — M329 Systemic lupus erythematosus, unspecified: Secondary | ICD-10-CM | POA: Diagnosis not present

## 2019-09-06 DIAGNOSIS — J439 Emphysema, unspecified: Secondary | ICD-10-CM | POA: Diagnosis not present

## 2019-09-06 DIAGNOSIS — G894 Chronic pain syndrome: Secondary | ICD-10-CM | POA: Diagnosis not present

## 2019-09-06 DIAGNOSIS — M5030 Other cervical disc degeneration, unspecified cervical region: Secondary | ICD-10-CM | POA: Diagnosis not present

## 2019-09-10 DIAGNOSIS — E785 Hyperlipidemia, unspecified: Secondary | ICD-10-CM | POA: Diagnosis not present

## 2019-09-10 DIAGNOSIS — Z79891 Long term (current) use of opiate analgesic: Secondary | ICD-10-CM | POA: Diagnosis not present

## 2019-09-10 DIAGNOSIS — N183 Chronic kidney disease, stage 3 unspecified: Secondary | ICD-10-CM | POA: Diagnosis not present

## 2019-09-29 ENCOUNTER — Ambulatory Visit (HOSPITAL_COMMUNITY)
Admission: EM | Admit: 2019-09-29 | Discharge: 2019-09-29 | Disposition: A | Discharge status: Home / Self Care | Payer: PPO | Attending: Family Medicine | Admitting: Family Medicine

## 2019-09-29 ENCOUNTER — Encounter (HOSPITAL_COMMUNITY): Payer: Self-pay

## 2019-09-29 ENCOUNTER — Telehealth (HOSPITAL_COMMUNITY): Payer: Self-pay | Admitting: Emergency Medicine

## 2019-09-29 ENCOUNTER — Other Ambulatory Visit: Payer: Self-pay

## 2019-09-29 DIAGNOSIS — R197 Diarrhea, unspecified: Secondary | ICD-10-CM | POA: Diagnosis not present

## 2019-09-29 NOTE — Telephone Encounter (Signed)
Patient has brought stool sample to [facility].  This nurse having difficulty finding order as in work flow, notified dr IT trainer.  Provider order changed, requisition printed.  Stasha, rad/phlebotomy to route sample and requisition.

## 2019-09-29 NOTE — Discharge Instructions (Addendum)
We have sent testing to try and determine what is causing your persistent diarrhea.

## 2019-09-29 NOTE — ED Triage Notes (Addendum)
Pt c/o 7/10 RLQ, LLQ abdominal pain and diarrheax1 wk. Pt states 25 bouts of diarrhea in the past 24 hrs. Pt states the BM is loose.

## 2019-10-01 NOTE — ED Provider Notes (Signed)
[facility]   [id number] 09/29/19 Arrival Time: 1241  ASSESSMENT & PLAN:  1. Diarrhea, unspecified type     Gastrointestinal Panel by PCR , Stool Order: [id number] Status:  In process    Awaiting stool results. Will do her best to ensure adequate fluid intake in order to avoid dehydration. Will proceed to the Emergency Department for evaluation if unable to tolerate PO fluids regularly.  Reviewed expectations re: course of current medical issues. Questions answered. Outlined signs and symptoms indicating need for more acute intervention. Patient verbalized understanding. After Visit Summary given.   SUBJECTIVE: History from: patient.  Teresa George is a 59 y.o. female who presents with complaint non-bloody diarrhea and vague lower abdominal cramping; diarrhea first. Loose stools approx 1 per hour. No n/v. Afebrile. No travel or camping. No known sick contacts. No back pain. Eating makes diarrhea worse. Tolerating fluids.  OTC treatment: none.  No LMP recorded. Patient has had a hysterectomy.  Past Surgical History:  Procedure Laterality Date  . ABDOMINAL HYSTERECTOMY    . ADENOIDECTOMY    . CARPAL TUNNEL RELEASE Left   . CHOLECYSTECTOMY    . LAPAROSCOPIC LUMBAR SYMPATHECTOMY    . medial epicondylectomy    . NEUROLYSIS MEDIAN NERVE Left   . pyloric obstruction repair    . subcutaneous transposition    . submuscular transposition    . TONSILLECTOMY      OBJECTIVE:  Vitals:   09/29/19 1327 09/29/19 1328  BP: 119/74   Pulse: 99   Resp: 18   Temp: 99.3 F (37.4 C)   TempSrc: Oral   SpO2: 98%   Weight:  49.9 kg  Height:  5\' 8"  (1.727 m)    General appearance: alert; no distress Lungs: unlabored Abdomen: soft; non-distended; no significant abdominal tenderness; reports "cramping" feeling; bowel sounds present; no masses or organomegaly; no guarding or rebound tenderness Back: no CVA tenderness Extremities: no edema; symmetrical with no gross  deformities Skin: warm; dry Neurologic: normal gait Psychological: alert and cooperative; normal mood and affect    Allergies  Allergen Reactions  . Aspirin Anaphylaxis    Due to kidney disease Due to kidney disease   . Cephalexin Anaphylaxis and Rash    Flare up of relex sympathetic dystrophy Flare up of relex sympathetic dystrophy Affected RSD Flare up of relex sympathetic dystrophy   . Morphine And Related Itching  . Sulfa Antibiotics Hives    Due to kidney disease   . Ciprofloxacin     Other reaction(s): Unknown RSD RSD   . Sulfamethoxazole-Trimethoprim Hives    Due to kidney disease Due to kidney disease   . Corticosteroids   . Nsaids   . Prednisone   . Tape Rash                                               Past Medical History:  Diagnosis Date  . Herniated disc, cervical   . Lupus ([facility])   . Reflex sympathetic dystrophy   . Renal disorder    Social History   Socioeconomic History  . Marital status: Legally Separated    Spouse name: Not on file  . Number of children: Not on file  . Years of education: Not on file  . Highest education level: Not on file  Occupational History  . Not on file  Tobacco Use  .  Smoking status: Current Every Day Smoker    Packs/day: 0.50    Types: Cigarettes  . Smokeless tobacco: Never Used  Substance and Sexual Activity  . Alcohol use: Never  . Drug use: Never  . Sexual activity: Not Currently  Other Topics Concern  . Not on file  Social History Narrative  . Not on file   Social Determinants of Health   Financial Resource Strain:   . Difficulty of Paying Living Expenses:   Food Insecurity:   . Worried About Charity fundraiser in the Last Year:   . Arboriculturist in the Last Year:   Transportation Needs:   . Film/video editor (Medical):   Marland Kitchen Lack of Transportation (Non-Medical):   Physical Activity:   . Days of Exercise per Week:   . Minutes of Exercise per Session:   Stress:   . Feeling of Stress  :   Social Connections:   . Frequency of Communication with Friends and Family:   . Frequency of Social Gatherings with Friends and Family:   . Attends Religious Services:   . Active Member of Clubs or Organizations:   . Attends Archivist Meetings:   Marland Kitchen Marital Status:   Intimate Partner Violence:   . Fear of Current or Ex-Partner:   . Emotionally Abused:   Marland Kitchen Physically Abused:   . Sexually Abused:    Family History  Problem Relation Age of Onset  . Breast cancer Maternal Herbert Moors, MD 10/03/19 [phone]

## 2019-10-02 LAB — GASTROINTESTINAL PANEL BY PCR, STOOL (REPLACES STOOL CULTURE)

## 2019-10-03 ENCOUNTER — Telehealth (HOSPITAL_COMMUNITY): Payer: Self-pay | Admitting: Family Medicine

## 2019-10-03 ENCOUNTER — Telehealth (HOSPITAL_COMMUNITY): Payer: Self-pay | Admitting: Orthopedic Surgery

## 2019-10-03 MED ORDER — FIDAXOMICIN 200 MG PO TABS: 200.0000 mg | ORAL_TABLET | Freq: Two times a day (BID) | ORAL | 0 refills | Status: DC

## 2019-10-03 NOTE — Telephone Encounter (Signed)
Per RN, still with significant diarrhea after antibiotic use. GI panel negative. Will treat empirically for C. Diff.  Meds ordered this encounter  Medications  . fidaxomicin (DIFICID) 200 MG TABS tablet    Sig: Take 1 tablet (200 mg total) by mouth 2 (two) times daily.    Dispense:  20 tablet    Refill:  0

## 2019-10-03 NOTE — Telephone Encounter (Signed)
Pt calling to check on GI Panel results. Informed pt it was negative. Pt also asking about Cdiff results. Pt stating she "is still going to the bathroom about 15 times a day with diarrhea"   Messages sent to MD Tracie Harrier, Per MD Hagler he will treat for C-diff and send antibiotic script to pharmacy.    Pt made aware and verbalized understanding and had all questions answered.

## 2019-10-10 ENCOUNTER — Ambulatory Visit
Admission: EM | Admit: 2019-10-10 | Discharge: 2019-10-10 | Disposition: A | Discharge status: Home / Self Care | Payer: PPO | Attending: Physician Assistant | Admitting: Physician Assistant

## 2019-10-10 DIAGNOSIS — L03012 Cellulitis of left finger: Secondary | ICD-10-CM

## 2019-10-10 MED ORDER — DOXYCYCLINE HYCLATE 100 MG PO CAPS: 100.0000 mg | ORAL_CAPSULE | Freq: Two times a day (BID) | ORAL | 0 refills | Status: DC

## 2019-10-10 MED ORDER — FLUCONAZOLE 150 MG PO TABS: 150.0000 mg | ORAL_TABLET | Freq: Every day | ORAL | 0 refills | Status: DC

## 2019-10-10 NOTE — ED Provider Notes (Signed)
[facility]    CSN: [id number] Arrival date & time: 10/10/19  1556      History   Chief Complaint Chief Complaint  Patient presents with  . Wound Check    HPI Teresa George is a 59 y.o. female.   59 year old female comes in for wound to the 3rd-5th finger x 3 days. Denies injury/trauma. Had pain, redness to the area without spreading. Denies fever. Left hand contracted at baseline, no changes. Was on doxycycline/clindamycin for cellulitis/dental abscess last month. Was then put on dificid for possible c diff.     Past Medical History:  Diagnosis Date  . Herniated disc, cervical   . Lupus ([place])   . Reflex sympathetic dystrophy   . Renal disorder     There are no problems to display for this patient.   Past Surgical History:  Procedure Laterality Date  . ABDOMINAL HYSTERECTOMY    . ADENOIDECTOMY    . CARPAL TUNNEL RELEASE Left   . CHOLECYSTECTOMY    . LAPAROSCOPIC LUMBAR SYMPATHECTOMY    . medial epicondylectomy    . NEUROLYSIS MEDIAN NERVE Left   . pyloric obstruction repair    . subcutaneous transposition    . submuscular transposition    . TONSILLECTOMY      OB History   No obstetric history on file.      Home Medications    Prior to Admission medications   Medication Sig Start Date End Date Taking? Authorizing Provider  albuterol (PROVENTIL HFA;VENTOLIN HFA) 108 (90 Base) MCG/ACT inhaler Inhale into the lungs. 10/03/14   [provider]  doxycycline (VIBRAMYCIN) 100 MG capsule Take 1 capsule (100 mg total) by mouth 2 (two) times daily. 10/10/19   Tasia Catchings, Niaja Stickley V, PA-C  ergocalciferol (VITAMIN D2) 1.25 MG (50000 UT) capsule Take 50,000 Units by mouth once a week.    [provider]  fidaxomicin (DIFICID) 200 MG TABS tablet Take 1 tablet (200 mg total) by mouth 2 (two) times daily. 10/03/19   Vanessa Kick, MD  fluconazole (DIFLUCAN) 150 MG tablet Take 1 tablet (150 mg total) by mouth daily. Take second dose 72 hours later if  symptoms still persists. 10/10/19   Tasia Catchings, Choya Tornow V, PA-C  oxycodone (ROXICODONE) 30 MG immediate release tablet Take 15 mg by mouth.  03/26/14   [provider]  Tiotropium Bromide-Olodaterol (STIOLTO RESPIMAT) 2.5-2.5 MCG/ACT AERS Inhale into the lungs.    [provider]  tiZANidine (ZANAFLEX) 4 MG capsule Take 4 mg by mouth 3 (three) times daily.    [provider]  fluticasone (FLONASE) 50 MCG/ACT nasal spray 1 spray by Each Nare route daily. 04/11/18 08/24/19  [provider]    Family History Family History  Problem Relation Age of Onset  . Breast cancer Maternal Aunt     Social History Social History   Tobacco Use  . Smoking status: Current Every Day Smoker    Packs/day: 0.50    Types: Cigarettes  . Smokeless tobacco: Never Used  Vaping Use  . Vaping Use: Never used  Substance Use Topics  . Alcohol use: Never  . Drug use: Never     Allergies   Aspirin, Cephalexin, Morphine and related, Sulfa antibiotics, Ciprofloxacin, Sulfamethoxazole-trimethoprim, Corticosteroids, Nsaids, Prednisone, and Tape   Review of Systems Review of Systems  Reason unable to perform ROS: See HPI as above.     Physical Exam Triage Vital Signs ED Triage Vitals  Enc Vitals Group     BP  10/10/19 1643 127/79     Pulse Rate 10/10/19 1643 99     Resp 10/10/19 1643 18     Temp 10/10/19 1643 98.5 F (36.9 C)     Temp Source 10/10/19 1643 Oral     SpO2 10/10/19 1643 97 %     Weight --      Height --      Head Circumference --      Peak Flow --      Pain Score 10/10/19 1652 7     Pain Loc --      Pain Edu? --      Excl. in GC? --    No data found.  Updated Vital Signs BP 127/79 (BP Location: Right Arm)   Pulse 99   Temp 98.5 F (36.9 C) (Oral)   Resp 18   SpO2 97%    Physical Exam Constitutional:      General: She is not in acute distress.    Appearance: Normal appearance. She is well-developed. She is not toxic-appearing or diaphoretic.  HENT:       Head: Normocephalic and atraumatic.  Eyes:     Conjunctiva/sclera: Conjunctivae normal.     Pupils: Pupils are equal, round, and reactive to light.  Pulmonary:     Effort: Pulmonary effort is normal. No respiratory distress.     Comments: Speaking in full sentences without difficulty Musculoskeletal:     Cervical back: Normal range of motion and neck supple.     Comments: Limited exam due to contracted fingers. See picture below. Erythema with few pustules to the fingers. Tender to touch, some warmth.   Skin:    General: Skin is warm and dry.  Neurological:     Mental Status: She is alert and oriented to person, place, and time.          UC Treatments / Results  Labs (all labs ordered are listed, but only abnormal results are displayed) Labs Reviewed - No data to display  EKG   Radiology No results found.  Procedures Procedures (including critical care time)  Medications Ordered in UC Medications - No data to display  Initial Impression / Assessment and Plan / UC Course  I have reviewed the triage vital signs and the nursing notes.  Pertinent labs & imaging results that were available during my care of the patient were reviewed by me and considered in my medical decision making (see chart for details).    Would like to cover for cellulitis given exam. Patient unable to use keflex or bactrim, will use doxycycline. Diflucan to cover for yeast. Otherwise patient to follow up with PCP for monitoring and further evaluation if symptoms not improving. Return precautions given.  Final Clinical Impressions(s) / UC Diagnoses   Final diagnoses:  Cellulitis of finger of left hand    ED Prescriptions    Medication Sig Dispense Auth. Provider   doxycycline (VIBRAMYCIN) 100 MG capsule Take 1 capsule (100 mg total) by mouth 2 (two) times daily. 20 capsule Azha Constantin V, PA-C   fluconazole (DIFLUCAN) 150 MG tablet Take 1 tablet (150 mg total) by mouth daily. Take second dose  72 hours later if symptoms still persists. 2 tablet Belinda Fisher, PA-C     PDMP not reviewed this encounter.   Belinda Fisher, PA-C 10/10/19 1814

## 2019-10-10 NOTE — ED Triage Notes (Signed)
Pt c/o wound/dry skin to lt 3rd - 5th digit x3 days. Pts lt hand has been contracted for 36 yrs and never had this. States has been on a lot of antibiotics for past month.

## 2019-10-10 NOTE — Discharge Instructions (Signed)
Start doxycycline as directed. Diflucan to cover for yeast. Warm compresses. Follow up with PCP for follow up and monitoring. If symptoms worsens, having fevers, go to the ED for further evaluation.

## 2019-10-11 ENCOUNTER — Telehealth: Payer: Self-pay | Admitting: Emergency Medicine

## 2019-10-11 NOTE — Telephone Encounter (Signed)
Late entry 1:28 pm left message for patient to return call.

## 2019-10-30 DIAGNOSIS — K639 Disease of intestine, unspecified: Secondary | ICD-10-CM | POA: Diagnosis not present

## 2019-10-30 DIAGNOSIS — Z1211 Encounter for screening for malignant neoplasm of colon: Secondary | ICD-10-CM | POA: Diagnosis not present

## 2019-11-16 DIAGNOSIS — M5136 Other intervertebral disc degeneration, lumbar region: Secondary | ICD-10-CM | POA: Diagnosis not present

## 2019-11-16 DIAGNOSIS — M5030 Other cervical disc degeneration, unspecified cervical region: Secondary | ICD-10-CM | POA: Diagnosis not present

## 2019-11-16 DIAGNOSIS — G90512 Complex regional pain syndrome I of left upper limb: Secondary | ICD-10-CM | POA: Diagnosis not present

## 2019-11-16 DIAGNOSIS — G894 Chronic pain syndrome: Secondary | ICD-10-CM | POA: Diagnosis not present

## 2019-12-07 DIAGNOSIS — Z1159 Encounter for screening for other viral diseases: Secondary | ICD-10-CM | POA: Diagnosis not present

## 2019-12-11 DIAGNOSIS — Z1211 Encounter for screening for malignant neoplasm of colon: Secondary | ICD-10-CM | POA: Diagnosis not present

## 2019-12-11 DIAGNOSIS — K5730 Diverticulosis of large intestine without perforation or abscess without bleeding: Secondary | ICD-10-CM | POA: Diagnosis not present

## 2019-12-12 DIAGNOSIS — M5030 Other cervical disc degeneration, unspecified cervical region: Secondary | ICD-10-CM | POA: Diagnosis not present

## 2019-12-12 DIAGNOSIS — G894 Chronic pain syndrome: Secondary | ICD-10-CM | POA: Diagnosis not present

## 2019-12-12 DIAGNOSIS — M5124 Other intervertebral disc displacement, thoracic region: Secondary | ICD-10-CM | POA: Diagnosis not present

## 2019-12-12 DIAGNOSIS — G90512 Complex regional pain syndrome I of left upper limb: Secondary | ICD-10-CM | POA: Diagnosis not present

## 2019-12-12 DIAGNOSIS — M5136 Other intervertebral disc degeneration, lumbar region: Secondary | ICD-10-CM | POA: Diagnosis not present

## 2019-12-12 DIAGNOSIS — M7121 Synovial cyst of popliteal space [Baker], right knee: Secondary | ICD-10-CM | POA: Diagnosis not present

## 2020-01-04 DIAGNOSIS — G894 Chronic pain syndrome: Secondary | ICD-10-CM | POA: Diagnosis not present

## 2020-01-04 DIAGNOSIS — M5136 Other intervertebral disc degeneration, lumbar region: Secondary | ICD-10-CM | POA: Diagnosis not present

## 2020-01-04 DIAGNOSIS — M5020 Other cervical disc displacement, unspecified cervical region: Secondary | ICD-10-CM | POA: Diagnosis not present

## 2020-01-04 DIAGNOSIS — M159 Polyosteoarthritis, unspecified: Secondary | ICD-10-CM | POA: Diagnosis not present

## 2020-01-04 DIAGNOSIS — J439 Emphysema, unspecified: Secondary | ICD-10-CM | POA: Diagnosis not present

## 2020-01-04 DIAGNOSIS — M5010 Cervical disc disorder with radiculopathy, unspecified cervical region: Secondary | ICD-10-CM | POA: Diagnosis not present

## 2020-01-04 DIAGNOSIS — M5134 Other intervertebral disc degeneration, thoracic region: Secondary | ICD-10-CM | POA: Diagnosis not present

## 2020-01-04 DIAGNOSIS — M5030 Other cervical disc degeneration, unspecified cervical region: Secondary | ICD-10-CM | POA: Diagnosis not present

## 2020-01-04 DIAGNOSIS — E559 Vitamin D deficiency, unspecified: Secondary | ICD-10-CM | POA: Diagnosis not present

## 2020-01-04 DIAGNOSIS — G8929 Other chronic pain: Secondary | ICD-10-CM | POA: Diagnosis not present

## 2020-01-04 DIAGNOSIS — Z79891 Long term (current) use of opiate analgesic: Secondary | ICD-10-CM | POA: Diagnosis not present

## 2020-01-04 DIAGNOSIS — G5642 Causalgia of left upper limb: Secondary | ICD-10-CM | POA: Diagnosis not present

## 2020-01-29 DIAGNOSIS — Z79891 Long term (current) use of opiate analgesic: Secondary | ICD-10-CM | POA: Diagnosis not present

## 2020-01-29 DIAGNOSIS — G8929 Other chronic pain: Secondary | ICD-10-CM | POA: Diagnosis not present

## 2020-01-29 DIAGNOSIS — N182 Chronic kidney disease, stage 2 (mild): Secondary | ICD-10-CM | POA: Diagnosis not present

## 2020-01-29 DIAGNOSIS — G894 Chronic pain syndrome: Secondary | ICD-10-CM | POA: Diagnosis not present

## 2020-01-29 DIAGNOSIS — M5020 Other cervical disc displacement, unspecified cervical region: Secondary | ICD-10-CM | POA: Diagnosis not present

## 2020-01-29 DIAGNOSIS — M5134 Other intervertebral disc degeneration, thoracic region: Secondary | ICD-10-CM | POA: Diagnosis not present

## 2020-01-29 DIAGNOSIS — G90519 Complex regional pain syndrome I of unspecified upper limb: Secondary | ICD-10-CM | POA: Diagnosis not present

## 2020-01-29 DIAGNOSIS — J439 Emphysema, unspecified: Secondary | ICD-10-CM | POA: Diagnosis not present

## 2020-01-29 DIAGNOSIS — M159 Polyosteoarthritis, unspecified: Secondary | ICD-10-CM | POA: Diagnosis not present

## 2020-01-29 DIAGNOSIS — M5136 Other intervertebral disc degeneration, lumbar region: Secondary | ICD-10-CM | POA: Diagnosis not present

## 2020-01-29 DIAGNOSIS — M5030 Other cervical disc degeneration, unspecified cervical region: Secondary | ICD-10-CM | POA: Diagnosis not present

## 2020-02-27 DIAGNOSIS — J439 Emphysema, unspecified: Secondary | ICD-10-CM | POA: Diagnosis not present

## 2020-02-27 DIAGNOSIS — M5136 Other intervertebral disc degeneration, lumbar region: Secondary | ICD-10-CM | POA: Diagnosis not present

## 2020-02-27 DIAGNOSIS — M5010 Cervical disc disorder with radiculopathy, unspecified cervical region: Secondary | ICD-10-CM | POA: Diagnosis not present

## 2020-02-27 DIAGNOSIS — G5642 Causalgia of left upper limb: Secondary | ICD-10-CM | POA: Diagnosis not present

## 2020-02-27 DIAGNOSIS — M5134 Other intervertebral disc degeneration, thoracic region: Secondary | ICD-10-CM | POA: Diagnosis not present

## 2020-02-27 DIAGNOSIS — M5030 Other cervical disc degeneration, unspecified cervical region: Secondary | ICD-10-CM | POA: Diagnosis not present

## 2020-02-27 DIAGNOSIS — E559 Vitamin D deficiency, unspecified: Secondary | ICD-10-CM | POA: Diagnosis not present

## 2020-02-27 DIAGNOSIS — M5020 Other cervical disc displacement, unspecified cervical region: Secondary | ICD-10-CM | POA: Diagnosis not present

## 2020-02-27 DIAGNOSIS — Z79891 Long term (current) use of opiate analgesic: Secondary | ICD-10-CM | POA: Diagnosis not present

## 2020-02-27 DIAGNOSIS — G8929 Other chronic pain: Secondary | ICD-10-CM | POA: Diagnosis not present

## 2020-02-27 DIAGNOSIS — M159 Polyosteoarthritis, unspecified: Secondary | ICD-10-CM | POA: Diagnosis not present

## 2020-02-27 DIAGNOSIS — G894 Chronic pain syndrome: Secondary | ICD-10-CM | POA: Diagnosis not present

## 2020-03-26 DIAGNOSIS — M5020 Other cervical disc displacement, unspecified cervical region: Secondary | ICD-10-CM | POA: Diagnosis not present

## 2020-03-26 DIAGNOSIS — M5010 Cervical disc disorder with radiculopathy, unspecified cervical region: Secondary | ICD-10-CM | POA: Diagnosis not present

## 2020-03-26 DIAGNOSIS — G894 Chronic pain syndrome: Secondary | ICD-10-CM | POA: Diagnosis not present

## 2020-03-26 DIAGNOSIS — M5030 Other cervical disc degeneration, unspecified cervical region: Secondary | ICD-10-CM | POA: Diagnosis not present

## 2020-03-26 DIAGNOSIS — G5642 Causalgia of left upper limb: Secondary | ICD-10-CM | POA: Diagnosis not present

## 2020-03-26 DIAGNOSIS — M5136 Other intervertebral disc degeneration, lumbar region: Secondary | ICD-10-CM | POA: Diagnosis not present

## 2020-03-26 DIAGNOSIS — G8929 Other chronic pain: Secondary | ICD-10-CM | POA: Diagnosis not present

## 2020-03-26 DIAGNOSIS — E559 Vitamin D deficiency, unspecified: Secondary | ICD-10-CM | POA: Diagnosis not present

## 2020-03-26 DIAGNOSIS — M159 Polyosteoarthritis, unspecified: Secondary | ICD-10-CM | POA: Diagnosis not present

## 2020-03-26 DIAGNOSIS — M5134 Other intervertebral disc degeneration, thoracic region: Secondary | ICD-10-CM | POA: Diagnosis not present

## 2020-03-26 DIAGNOSIS — Z79891 Long term (current) use of opiate analgesic: Secondary | ICD-10-CM | POA: Diagnosis not present

## 2020-03-26 DIAGNOSIS — N182 Chronic kidney disease, stage 2 (mild): Secondary | ICD-10-CM | POA: Diagnosis not present

## 2020-04-23 DIAGNOSIS — G5642 Causalgia of left upper limb: Secondary | ICD-10-CM | POA: Diagnosis not present

## 2020-04-23 DIAGNOSIS — M5134 Other intervertebral disc degeneration, thoracic region: Secondary | ICD-10-CM | POA: Diagnosis not present

## 2020-04-23 DIAGNOSIS — M5030 Other cervical disc degeneration, unspecified cervical region: Secondary | ICD-10-CM | POA: Diagnosis not present

## 2020-04-23 DIAGNOSIS — M5020 Other cervical disc displacement, unspecified cervical region: Secondary | ICD-10-CM | POA: Diagnosis not present

## 2020-04-23 DIAGNOSIS — G90519 Complex regional pain syndrome I of unspecified upper limb: Secondary | ICD-10-CM | POA: Diagnosis not present

## 2020-04-23 DIAGNOSIS — Z79891 Long term (current) use of opiate analgesic: Secondary | ICD-10-CM | POA: Diagnosis not present

## 2020-04-23 DIAGNOSIS — M5010 Cervical disc disorder with radiculopathy, unspecified cervical region: Secondary | ICD-10-CM | POA: Diagnosis not present

## 2020-04-23 DIAGNOSIS — G894 Chronic pain syndrome: Secondary | ICD-10-CM | POA: Diagnosis not present

## 2020-04-23 DIAGNOSIS — M159 Polyosteoarthritis, unspecified: Secondary | ICD-10-CM | POA: Diagnosis not present

## 2020-04-23 DIAGNOSIS — M5136 Other intervertebral disc degeneration, lumbar region: Secondary | ICD-10-CM | POA: Diagnosis not present

## 2020-04-23 DIAGNOSIS — Z681 Body mass index (BMI) 19 or less, adult: Secondary | ICD-10-CM | POA: Diagnosis not present

## 2020-04-23 DIAGNOSIS — G8929 Other chronic pain: Secondary | ICD-10-CM | POA: Diagnosis not present

## 2020-11-17 ENCOUNTER — Other Ambulatory Visit: Payer: Self-pay | Admitting: Physician Assistant

## 2020-11-17 DIAGNOSIS — Z13820 Encounter for screening for osteoporosis: Secondary | ICD-10-CM

## 2020-12-10 ENCOUNTER — Other Ambulatory Visit: Payer: Self-pay | Admitting: Physician Assistant

## 2020-12-10 DIAGNOSIS — Z1231 Encounter for screening mammogram for malignant neoplasm of breast: Secondary | ICD-10-CM

## 2021-01-01 ENCOUNTER — Other Ambulatory Visit: Payer: Self-pay

## 2021-01-01 ENCOUNTER — Ambulatory Visit
Admission: RE | Admit: 2021-01-01 | Discharge: 2021-01-01 | Disposition: A | Discharge status: Home / Self Care | Payer: Medicare Other | Source: Ambulatory Visit | Attending: Physician Assistant | Admitting: Physician Assistant

## 2021-01-01 DIAGNOSIS — Z1231 Encounter for screening mammogram for malignant neoplasm of breast: Secondary | ICD-10-CM

## 2021-04-16 ENCOUNTER — Encounter (HOSPITAL_COMMUNITY): Payer: Self-pay

## 2021-04-16 ENCOUNTER — Other Ambulatory Visit: Payer: Self-pay

## 2021-04-16 ENCOUNTER — Emergency Department (HOSPITAL_BASED_OUTPATIENT_CLINIC_OR_DEPARTMENT_OTHER)
Admission: EM | Admit: 2021-04-16 | Discharge: 2021-04-16 | Disposition: A | Discharge status: Home / Self Care | Payer: Medicare Other | Attending: Emergency Medicine | Admitting: Emergency Medicine

## 2021-04-16 ENCOUNTER — Ambulatory Visit (HOSPITAL_COMMUNITY)
Admission: EM | Admit: 2021-04-16 | Discharge: 2021-04-16 | Disposition: A | Discharge status: Home / Self Care | Payer: Medicare Other

## 2021-04-16 ENCOUNTER — Encounter (HOSPITAL_BASED_OUTPATIENT_CLINIC_OR_DEPARTMENT_OTHER): Payer: Self-pay

## 2021-04-16 ENCOUNTER — Emergency Department (HOSPITAL_BASED_OUTPATIENT_CLINIC_OR_DEPARTMENT_OTHER): Payer: Medicare Other

## 2021-04-16 DIAGNOSIS — Z9049 Acquired absence of other specified parts of digestive tract: Secondary | ICD-10-CM

## 2021-04-16 DIAGNOSIS — K5792 Diverticulitis of intestine, part unspecified, without perforation or abscess without bleeding: Secondary | ICD-10-CM

## 2021-04-16 DIAGNOSIS — R109 Unspecified abdominal pain: Secondary | ICD-10-CM | POA: Diagnosis present

## 2021-04-16 DIAGNOSIS — R1032 Left lower quadrant pain: Secondary | ICD-10-CM | POA: Diagnosis not present

## 2021-04-16 LAB — CBC
HCT: 44.4 % (ref 36.0–46.0)
Hemoglobin: 15.2 g/dL — ABNORMAL HIGH (ref 12.0–15.0)
MCH: 32.4 pg (ref 26.0–34.0)
MCHC: 34.2 g/dL (ref 30.0–36.0)
MCV: 94.7 fL (ref 80.0–100.0)
Platelets: 205 10*3/uL (ref 150–400)
RBC: 4.69 MIL/uL (ref 3.87–5.11)
RDW: 12 % (ref 11.5–15.5)
WBC: 8 10*3/uL (ref 4.0–10.5)
nRBC: 0 % (ref 0.0–0.2)

## 2021-04-16 LAB — COMPREHENSIVE METABOLIC PANEL
ALT: 16 U/L (ref 0–44)
AST: 17 U/L (ref 15–41)
Albumin: 4.8 g/dL (ref 3.5–5.0)
Alkaline Phosphatase: 81 U/L (ref 38–126)
Anion gap: 7 (ref 5–15)
BUN: 12 mg/dL (ref 8–23)
CO2: 30 mmol/L (ref 22–32)
Calcium: 9.7 mg/dL (ref 8.9–10.3)
Chloride: 102 mmol/L (ref 98–111)
Creatinine, Ser: 0.98 mg/dL (ref 0.44–1.00)
GFR, Estimated: >60 mL/min (ref >60)
Glucose, Bld: 100 mg/dL — ABNORMAL HIGH (ref 70–99)
Potassium: 3.7 mmol/L (ref 3.5–5.1)
Sodium: 139 mmol/L (ref 135–145)
Total Bilirubin: 1.1 mg/dL (ref 0.3–1.2)
Total Protein: 7.4 g/dL (ref 6.5–8.1)

## 2021-04-16 LAB — URINALYSIS, ROUTINE W REFLEX MICROSCOPIC
Bilirubin Urine: NEGATIVE
Glucose, UA: NEGATIVE mg/dL
Hgb urine dipstick: NEGATIVE
Leukocytes,Ua: NEGATIVE
Nitrite: NEGATIVE
Specific Gravity, Urine: 1.01 (ref 1.005–1.030)
pH: 6 (ref 5.0–8.0)

## 2021-04-16 LAB — LIPASE, BLOOD: Lipase: 18 U/L (ref 11–51)

## 2021-04-16 MED ORDER — ONDANSETRON 8 MG PO TBDP: 8.0000 mg | ORAL_TABLET | Freq: Three times a day (TID) | ORAL | 0 refills | Status: DC | PRN

## 2021-04-16 MED ORDER — ACETAMINOPHEN 500 MG PO TABS
1000.0000 mg | ORAL_TABLET | Freq: Once | ORAL | Status: AC
  Administered 2021-04-16: 19:00:00 1000 mg via ORAL
  Filled 2021-04-16: qty 2

## 2021-04-16 MED ORDER — IOHEXOL 300 MG/ML  SOLN
80.0000 mL | Freq: Once | INTRAMUSCULAR | Status: AC | PRN
  Administered 2021-04-16: 17:00:00 80 mL via INTRAVENOUS

## 2021-04-16 MED ORDER — AMOXICILLIN-POT CLAVULANATE 875-125 MG PO TABS: 1.0000 | ORAL_TABLET | Freq: Two times a day (BID) | ORAL | 0 refills | Status: DC

## 2021-04-16 MED ORDER — PIPERACILLIN-TAZOBACTAM 3.375 G IVPB 30 MIN
3.3750 g | Freq: Once | INTRAVENOUS | Status: AC
  Administered 2021-04-16: 19:00:00 3.375 g via INTRAVENOUS
  Filled 2021-04-16: qty 50

## 2021-04-16 NOTE — ED Provider Notes (Addendum)
[facility] EMERGENCY DEPT Provider Note   CSN: [id number] Arrival date & time: 04/16/21  1329     History Chief Complaint  Patient presents with   Abdominal Pain    Teresa George is a 60 y.o. female.  Patient c/o lower abd pain for past two days. Symptoms acute onset, moderate, constant, dull, non radiating. Hx diverticula/itis. Is having normal bms, including today. No abd distension or vomiting. No back/flank pain. No dysuria or gu c/o. Remote hx hysterectomy. No fever or chills.   The history is provided by the patient and medical records.  Abdominal Pain Associated symptoms: no chest pain, no chills, no dysuria, no fever, no shortness of breath, no sore throat and no vomiting       Past Medical History:  Diagnosis Date   Herniated disc, cervical    Lupus (HCC)    Reflex sympathetic dystrophy    Renal disorder     There are no problems to display for this patient.   Past Surgical History:  Procedure Laterality Date   ABDOMINAL HYSTERECTOMY     ADENOIDECTOMY     CARPAL TUNNEL RELEASE Left    CHOLECYSTECTOMY     LAPAROSCOPIC LUMBAR SYMPATHECTOMY     medial epicondylectomy     NEUROLYSIS MEDIAN NERVE Left    pyloric obstruction repair     subcutaneous transposition     submuscular transposition     TONSILLECTOMY       OB History   No obstetric history on file.     Family History  Problem Relation Age of Onset   Breast cancer Maternal Aunt     Social History   Tobacco Use   Smoking status: Every Day    Packs/day: 0.50    Types: Cigarettes   Smokeless tobacco: Never  Vaping Use   Vaping Use: Never used  Substance Use Topics   Alcohol use: Never   Drug use: Never    Home Medications Prior to Admission medications   Medication Sig Start Date End Date Taking? Authorizing Provider  albuterol (PROVENTIL HFA;VENTOLIN HFA) 108 (90 Base) MCG/ACT inhaler Inhale into the lungs. 10/03/14   [provider]  doxycycline  (VIBRAMYCIN) 100 MG capsule Take 1 capsule (100 mg total) by mouth 2 (two) times daily. 10/10/19   Cathie Hoops, Amy V, PA-C  ergocalciferol (VITAMIN D2) 1.25 MG (50000 UT) capsule Take 50,000 Units by mouth once a week.    [provider]  fidaxomicin (DIFICID) 200 MG TABS tablet Take 1 tablet (200 mg total) by mouth 2 (two) times daily. 10/03/19   Mardella Layman, MD  fluconazole (DIFLUCAN) 150 MG tablet Take 1 tablet (150 mg total) by mouth daily. Take second dose 72 hours later if symptoms still persists. 10/10/19   Cathie Hoops, Amy V, PA-C  oxycodone (ROXICODONE) 30 MG immediate release tablet Take 15 mg by mouth.  03/26/14   [provider]  Tiotropium Bromide-Olodaterol (STIOLTO RESPIMAT) 2.5-2.5 MCG/ACT AERS Inhale into the lungs.    [provider]  tiZANidine (ZANAFLEX) 4 MG capsule Take 4 mg by mouth 3 (three) times daily.    [provider]  fluticasone (FLONASE) 50 MCG/ACT nasal spray 1 spray by Each Nare route daily. 04/11/18 08/24/19  [provider]    Allergies    Aspirin, Cephalexin, Morphine and related, Sulfa antibiotics, Ciprofloxacin, Sulfamethoxazole-trimethoprim, Corticosteroids, Nsaids, Prednisone, and Tape  Review of Systems   Review of Systems  Constitutional:  Negative for chills and fever.  HENT:  Negative for  sore throat.   Eyes:  Negative for redness.  Respiratory:  Negative for shortness of breath.   Cardiovascular:  Negative for chest pain.  Gastrointestinal:  Positive for abdominal pain. Negative for vomiting.  Genitourinary:  Negative for dysuria and flank pain.  Musculoskeletal:  Negative for back pain.  Skin:  Negative for rash.  Neurological:  Negative for headaches.  Hematological:  Does not bruise/bleed easily.  Psychiatric/Behavioral:  Negative for confusion.    Physical Exam Updated Vital Signs BP (!) 118/105    Pulse 63    Temp 98.4 F (36.9 C) (Oral)    Resp 20    Ht 1.727 m (5\' 8" )    Wt 53.5 kg    BMI 17.94 kg/m    Physical Exam Vitals and nursing note reviewed.  Constitutional:      Appearance: Normal appearance. She is well-developed.  HENT:     Head: Atraumatic.     Nose: Nose normal.     Mouth/Throat:     Mouth: Mucous membranes are moist.  Eyes:     General: No scleral icterus.    Conjunctiva/sclera: Conjunctivae normal.  Neck:     Trachea: No tracheal deviation.  Cardiovascular:     Rate and Rhythm: Normal rate and regular rhythm.     Pulses: Normal pulses.     Heart sounds: Normal heart sounds. No murmur heard.   No friction rub. No gallop.  Pulmonary:     Effort: Pulmonary effort is normal. No respiratory distress.     Breath sounds: Normal breath sounds.  Abdominal:     General: Bowel sounds are normal. There is no distension.     Palpations: Abdomen is soft. There is no mass.     Tenderness: There is abdominal tenderness. There is no guarding or rebound.     Hernia: No hernia is present.     Comments: LLQ tenderness.   Genitourinary:    Comments: No cva tenderness.  Musculoskeletal:        General: No swelling.     Cervical back: Normal range of motion and neck supple. No rigidity. No muscular tenderness.  Skin:    General: Skin is warm and dry.     Findings: No rash.  Neurological:     Mental Status: She is alert.     Comments: Alert, speech normal.   Psychiatric:        Mood and Affect: Mood normal.    ED Results / Procedures / Treatments   Labs (all labs ordered are listed, but only abnormal results are displayed) Results for orders placed or performed during the hospital encounter of 04/16/21  Lipase, blood  Result Value Ref Range   Lipase 18 11 - 51 U/L  Comprehensive metabolic panel  Result Value Ref Range   Sodium 139 135 - 145 mmol/L   Potassium 3.7 3.5 - 5.1 mmol/L   Chloride 102 98 - 111 mmol/L   CO2 30 22 - 32 mmol/L   Glucose, Bld 100 (H) 70 - 99 mg/dL   BUN 12 8 - 23 mg/dL   Creatinine, Ser 04/18/21 0.44 - 1.00 mg/dL   Calcium 9.7 8.9 - 2.35  mg/dL   Total Protein 7.4 6.5 - 8.1 g/dL   Albumin 4.8 3.5 - 5.0 g/dL   AST 17 15 - 41 U/L   ALT 16 0 - 44 U/L   Alkaline Phosphatase 81 38 - 126 U/L   Total Bilirubin 1.1 0.3 - 1.2 mg/dL   GFR, Estimated >  60 >60 mL/min   Anion gap 7 5 - 15  CBC  Result Value Ref Range   WBC 8.0 4.0 - 10.5 K/uL   RBC 4.69 3.87 - 5.11 MIL/uL   Hemoglobin 15.2 (H) 12.0 - 15.0 g/dL   HCT 16.1 09.6 - 04.5 %   MCV 94.7 80.0 - 100.0 fL   MCH 32.4 26.0 - 34.0 pg   MCHC 34.2 30.0 - 36.0 g/dL   RDW 40.9 81.1 - 91.4 %   Platelets 205 150 - 400 K/uL   nRBC 0.0 0.0 - 0.2 %  Urinalysis, Routine w reflex microscopic Urine, Clean Catch  Result Value Ref Range   Color, Urine YELLOW YELLOW   APPearance CLEAR CLEAR   Specific Gravity, Urine 1.010 1.005 - 1.030   pH 6.0 5.0 - 8.0   Glucose, UA NEGATIVE NEGATIVE mg/dL   Hgb urine dipstick NEGATIVE NEGATIVE   Bilirubin Urine NEGATIVE NEGATIVE   Ketones, ur TRACE (A) NEGATIVE mg/dL   Protein, ur TRACE (A) NEGATIVE mg/dL   Nitrite NEGATIVE NEGATIVE   Leukocytes,Ua NEGATIVE NEGATIVE     EKG None  Radiology CT Abdomen Pelvis W Contrast  Result Date: 04/16/2021 CLINICAL DATA:  Abdominal pain, acute, nonlocalized lower abd pain EXAM: CT ABDOMEN AND PELVIS WITH CONTRAST TECHNIQUE: Multidetector CT imaging of the abdomen and pelvis was performed using the standard protocol following bolus administration of intravenous contrast. CONTRAST:  35mL OMNIPAQUE IOHEXOL 300 MG/ML  SOLN COMPARISON:  None. FINDINGS: Lower chest: No acute abnormality. Hepatobiliary: No focal liver abnormality is seen. Status post cholecystectomy. No biliary dilatation. Pancreas: Unremarkable. Spleen: Unremarkable. Adrenals/Urinary Tract: Adrenals are unremarkable. Too small to characterize low-density renal lesions statistically probably represent cysts. Partially distended bladder is unremarkable. Stomach/Bowel: Stomach is within normal limits. Bowel is normal in caliber. Normal appendix. Distal  colonic diverticulosis. Infiltration of fat adjacent to the sigmoid colon. Vascular/Lymphatic: Aortic atherosclerosis.  No enlarged nodes. Reproductive: Status post hysterectomy. No adnexal masses. Other: Trace free fluid in the pelvis. No free air. Abdominal wall is unremarkable. Musculoskeletal: No acute osseous abnormality. IMPRESSION: Acute sigmoid diverticulitis. Trace free fluid in the pelvis.  No organized collection. Aortic Atherosclerosis (ICD10-I70.0). Electronically Signed   By: Guadlupe Spanish M.D.   On: 04/16/2021 16:56    Procedures Procedures   Medications Ordered in ED Medications - No data to display  ED Course  I have reviewed the triage vital signs and the nursing notes.  Pertinent labs & imaging results that were available during my care of the patient were reviewed by me and considered in my medical decision making (see chart for details).    MDM Rules/Calculators/A&P                         Iv ns. Labs sent. Imaging ordered.   Reviewed nursing notes and prior charts for additional history.   Labs reviewed/interpreted by me - wbc normal.   CT reviewed/interpreted by me - diverticulitis.   Pt drove self to ED. Acetaminophen po.  Zosyn iv.   Discussed ct w pt.   Pt appears stable for d/c.   Return precautions provided.       Final Clinical Impression(s) / ED Diagnoses Final diagnoses:  None    Rx / DC Orders ED Discharge Orders     None        Cathren Laine, MD 04/16/21 Diana Eves    Cathren Laine, MD 04/16/21 [phone]

## 2021-04-16 NOTE — ED Triage Notes (Signed)
Pt presents with c/o lower abdominal pain x 4 days. States yesterday she felt worse.   States she feels if she has a bowel movement she will feel better. States she feels pressure in her abdomen and states it hurts to go to the bathroom. States she is concerned of IBS.

## 2021-04-16 NOTE — ED Provider Notes (Signed)
[facility]    CSN: [id number] Arrival date & time: 04/16/21  1140      History   Chief Complaint Chief Complaint  Patient presents with   Abdominal Pain    HPI Teresa George is a 60 y.o. female presenting with severe abdominal pain for 4 days.  Medical history cholecystectomy, hysterectomy, lupus, chronic pain.  States that the pain is worse in her left lower quadrant, with radiation to the center of her abdomen.  States it is very painful to move, the pain kept her up last night.  She has been having regular bowel movements and is still passing gas.  States that it feels like if she had a bowel movement and the pain would go away, but this has not proven to be true.  Mild nausea, no vomiting or diarrhea.  She is concerned for appendicitis, she does still have her appendix.  HPI  Past Medical History:  Diagnosis Date   Herniated disc, cervical    Lupus (HCC)    Reflex sympathetic dystrophy    Renal disorder     There are no problems to display for this patient.   Past Surgical History:  Procedure Laterality Date   ABDOMINAL HYSTERECTOMY     ADENOIDECTOMY     CARPAL TUNNEL RELEASE Left    CHOLECYSTECTOMY     LAPAROSCOPIC LUMBAR SYMPATHECTOMY     medial epicondylectomy     NEUROLYSIS MEDIAN NERVE Left    pyloric obstruction repair     subcutaneous transposition     submuscular transposition     TONSILLECTOMY      OB History   No obstetric history on file.      Home Medications    Prior to Admission medications   Medication Sig Start Date End Date Taking? Authorizing Provider  albuterol (PROVENTIL HFA;VENTOLIN HFA) 108 (90 Base) MCG/ACT inhaler Inhale into the lungs. 10/03/14   [provider]  doxycycline (VIBRAMYCIN) 100 MG capsule Take 1 capsule (100 mg total) by mouth 2 (two) times daily. 10/10/19   Cathie Hoops, Amy V, PA-C  ergocalciferol (VITAMIN D2) 1.25 MG (50000 UT) capsule Take 50,000 Units by mouth once a week.    [provider]  fidaxomicin (DIFICID) 200 MG TABS tablet Take 1 tablet (200 mg total) by mouth 2 (two) times daily. 10/03/19   Mardella Layman, MD  fluconazole (DIFLUCAN) 150 MG tablet Take 1 tablet (150 mg total) by mouth daily. Take second dose 72 hours later if symptoms still persists. 10/10/19   Cathie Hoops, Amy V, PA-C  oxycodone (ROXICODONE) 30 MG immediate release tablet Take 15 mg by mouth.  03/26/14   [provider]  Tiotropium Bromide-Olodaterol (STIOLTO RESPIMAT) 2.5-2.5 MCG/ACT AERS Inhale into the lungs.    [provider]  tiZANidine (ZANAFLEX) 4 MG capsule Take 4 mg by mouth 3 (three) times daily.    [provider]  fluticasone (FLONASE) 50 MCG/ACT nasal spray 1 spray by Each Nare route daily. 04/11/18 08/24/19  [provider]    Family History Family History  Problem Relation Age of Onset   Breast cancer Maternal Aunt     Social History Social History   Tobacco Use   Smoking status: Every Day    Packs/day: 0.50    Types: Cigarettes   Smokeless tobacco: Never  Vaping Use   Vaping Use: Never used  Substance Use Topics   Alcohol use: Never   Drug use: Never     Allergies   Aspirin,  Cephalexin, Morphine and related, Sulfa antibiotics, Ciprofloxacin, Sulfamethoxazole-trimethoprim, Corticosteroids, Nsaids, Prednisone, and Tape   Review of Systems Review of Systems  Constitutional:  Negative for appetite change, chills, diaphoresis, fever and unexpected weight change.  HENT:  Negative for congestion, ear pain, sinus pressure, sinus pain, sneezing, sore throat and trouble swallowing.   Respiratory:  Negative for cough, chest tightness and shortness of breath.   Cardiovascular:  Negative for chest pain.  Gastrointestinal:  Positive for abdominal pain. Negative for abdominal distention, anal bleeding, blood in stool, constipation, diarrhea, nausea, rectal pain and vomiting.  Genitourinary:  Negative for dysuria, flank pain, frequency and urgency.   Musculoskeletal:  Negative for back pain and myalgias.  Neurological:  Negative for dizziness, light-headedness and headaches.  All other systems reviewed and are negative.   Physical Exam Triage Vital Signs ED Triage Vitals  Enc Vitals Group     BP 04/16/21 1221 123/77     Pulse Rate 04/16/21 1221 (!) 105     Resp 04/16/21 1221 16     Temp 04/16/21 1221 98.9 F (37.2 C)     Temp Source 04/16/21 1221 Oral     SpO2 04/16/21 1221 96 %     Weight --      Height --      Head Circumference --      Peak Flow --      Pain Score 04/16/21 1220 6     Pain Loc --      Pain Edu? --      Excl. in GC? --    No data found.  Updated Vital Signs BP 123/77 (BP Location: Left Arm)    Pulse (!) 105    Temp 98.9 F (37.2 C) (Oral)    Resp 16    SpO2 96%   Visual Acuity Right Eye Distance:   Left Eye Distance:   Bilateral Distance:    Right Eye Near:   Left Eye Near:    Bilateral Near:     Physical Exam Vitals reviewed.  Constitutional:      General: She is not in acute distress.    Appearance: Normal appearance. She is not ill-appearing.  HENT:     Head: Normocephalic and atraumatic.     Mouth/Throat:     Mouth: Mucous membranes are moist.     Comments: Moist mucous membranes Eyes:     Extraocular Movements: Extraocular movements intact.     Pupils: Pupils are equal, round, and reactive to light.  Cardiovascular:     Rate and Rhythm: Normal rate and regular rhythm.     Heart sounds: Normal heart sounds.  Pulmonary:     Effort: Pulmonary effort is normal.     Breath sounds: Normal breath sounds. No wheezing, rhonchi or rales.  Abdominal:     General: Bowel sounds are normal. There is no distension.     Palpations: Abdomen is soft. There is no mass.     Tenderness: There is abdominal tenderness in the left lower quadrant. There is no right CVA tenderness, left CVA tenderness, guarding or rebound. Negative signs include Murphy's sign, Rovsing's sign and McBurney's sign.      Comments: Patient uncomfortable at rest and clutching stomach while walking. Severe pain with shallow palpation of LLQ. No rebound. No obvious mass or hernia. Uncomfortable throughout exam.  Skin:    General: Skin is warm.     Capillary Refill: Capillary refill takes less than 2 seconds.     Comments: Good skin turgor  Neurological:     General: No focal deficit present.     Mental Status: She is alert and oriented to person, place, and time.  Psychiatric:        Mood and Affect: Mood normal.        Behavior: Behavior normal.     UC Treatments / Results  Labs (all labs ordered are listed, but only abnormal results are displayed) Labs Reviewed - No data to display  EKG   Radiology No results found.  Procedures Procedures (including critical care time)  Medications Ordered in UC Medications - No data to display  Initial Impression / Assessment and Plan / UC Course  I have reviewed the triage vital signs and the nursing notes.  Pertinent labs & imaging results that were available during my care of the patient were reviewed by me and considered in my medical decision making (see chart for details).     This patient is a very pleasant 60 y.o. year old female presenting with severe abd pain, worse in LLQ. Afebrile, borderline tachy. History cholecystectomy, but she does still have her appendix. No prior history diverticular ds. As we do not have CT imaging at this urgent care, sent to ED via personal vehicle, she is in agreement.  Final Clinical Impressions(s) / UC Diagnoses   Final diagnoses:  Left lower quadrant abdominal pain  History of cholecystectomy     Discharge Instructions      -I'm very concerned about how severe your abdominal pain is. You might have appendicitis, diverticulitis, etc. You'll probably need a CT scan of the abdomen to diagnose this. Please head straight to the ED.   ED Prescriptions   None    PDMP not reviewed this encounter.   Rhys Martini, PA-C 04/16/21 1306

## 2021-04-16 NOTE — Discharge Instructions (Addendum)
-  I'm very concerned about how severe your abdominal pain is. You might have appendicitis, diverticulitis, etc. You'll probably need a CT scan of the abdomen to diagnose this. Please head straight to the ED.

## 2021-04-16 NOTE — Discharge Instructions (Addendum)
It was our pleasure to provide your ER care today - we hope that you feel better.  Your ct shows diverticulitis. Take antibiotic as prescribed. Drink plenty of fluids/stay well hydrated. Take acetaminophen as need for pain.  You may also take your pain med as need.  Take zofran as need if nauseated.   Follow up with primary care doctor in one week if symptoms fail to improve/resolve.  Return to ER if worse, new symptoms, worsening or severe pain, persistent vomiting, or other concern.

## 2021-04-16 NOTE — ED Triage Notes (Signed)
Pt c/o 2 day hx of abd pain that became worse yesterday. Pt reports abd and rectal pressure. Pt last BM was today, but pt had no relief.

## 2021-06-01 ENCOUNTER — Ambulatory Visit
Admission: RE | Admit: 2021-06-01 | Discharge: 2021-06-01 | Disposition: A | Discharge status: Home / Self Care | Payer: Medicare Other | Source: Ambulatory Visit | Attending: Physician Assistant | Admitting: Physician Assistant

## 2021-06-01 ENCOUNTER — Other Ambulatory Visit: Payer: Self-pay

## 2021-06-01 DIAGNOSIS — Z13820 Encounter for screening for osteoporosis: Secondary | ICD-10-CM

## 2023-03-12 ENCOUNTER — Ambulatory Visit (HOSPITAL_COMMUNITY)
Admission: EM | Admit: 2023-03-12 | Discharge: 2023-03-12 | Disposition: A | Discharge status: Home / Self Care | Payer: Medicare HMO | Attending: Emergency Medicine | Admitting: Emergency Medicine

## 2023-03-12 ENCOUNTER — Ambulatory Visit (INDEPENDENT_AMBULATORY_CARE_PROVIDER_SITE_OTHER): Payer: Medicare HMO

## 2023-03-12 ENCOUNTER — Encounter (HOSPITAL_COMMUNITY): Payer: Self-pay | Admitting: *Deleted

## 2023-03-12 DIAGNOSIS — J189 Pneumonia, unspecified organism: Secondary | ICD-10-CM | POA: Diagnosis not present

## 2023-03-12 DIAGNOSIS — J441 Chronic obstructive pulmonary disease with (acute) exacerbation: Secondary | ICD-10-CM | POA: Diagnosis not present

## 2023-03-12 MED ORDER — IPRATROPIUM-ALBUTEROL 0.5-2.5 (3) MG/3ML IN SOLN
RESPIRATORY_TRACT | Status: AC
  Filled 2023-03-12: qty 3

## 2023-03-12 MED ORDER — AZITHROMYCIN 250 MG PO TABS: 250.0000 mg | ORAL_TABLET | ORAL | 0 refills | Status: DC

## 2023-03-12 MED ORDER — BENZONATATE 100 MG PO CAPS: 100.0000 mg | ORAL_CAPSULE | Freq: Three times a day (TID) | ORAL | 0 refills | Status: DC | PRN

## 2023-03-12 MED ORDER — IPRATROPIUM-ALBUTEROL 0.5-2.5 (3) MG/3ML IN SOLN
3.0000 mL | Freq: Once | RESPIRATORY_TRACT | Status: AC
  Administered 2023-03-12: 3 mL via RESPIRATORY_TRACT

## 2023-03-12 MED ORDER — DOXYCYCLINE HYCLATE 100 MG PO CAPS: 100.0000 mg | ORAL_CAPSULE | Freq: Two times a day (BID) | ORAL | 0 refills | Status: AC

## 2023-03-12 NOTE — ED Triage Notes (Signed)
Pt states she has productive cough, congestion, fever X 1 week. She has been taking delsym, muscinex and nyquil. She does have an albuterol MDI she can use as needed last dose yesterday. Hasn't used her Stiolto in a few days.

## 2023-03-12 NOTE — ED Provider Notes (Signed)
[facility]    CSN: [id number] Arrival date & time: 03/12/23  1012      History   Chief Complaint Chief Complaint  Patient presents with   Cough   Nasal Congestion   Fever    HPI Teresa George is a 63 y.o. female.  Over 1 week history of productive cough, nasal congestion Having yellow/green thick phlegm  Reports 101 fever yesterday  Not short of breath. Some tightness in chest with cough Has tried delsym, mucinex, nyquil, albuterol inhaler  Has not been using her steroid inhaler  History of COPD, lupus, lung nodule followed by pulmonology   Daughter sick recently No recent travel   Past Medical History:  Diagnosis Date   Herniated disc, cervical    Lupus    Reflex sympathetic dystrophy    Renal disorder     There are no problems to display for this patient.   Past Surgical History:  Procedure Laterality Date   ABDOMINAL HYSTERECTOMY     ADENOIDECTOMY     CARPAL TUNNEL RELEASE Left    CHOLECYSTECTOMY     LAPAROSCOPIC LUMBAR SYMPATHECTOMY     medial epicondylectomy     NEUROLYSIS MEDIAN NERVE Left    pyloric obstruction repair     subcutaneous transposition     submuscular transposition     TONSILLECTOMY      OB History   No obstetric history on file.      Home Medications    Prior to Admission medications   Medication Sig Start Date End Date Taking? Authorizing Provider  albuterol (PROVENTIL HFA;VENTOLIN HFA) 108 (90 Base) MCG/ACT inhaler Inhale into the lungs. 10/03/14  Yes [provider]  azithromycin (ZITHROMAX) 250 MG tablet Take 1 tablet (250 mg total) by mouth as directed. Take 2 tablets together on day 1, then take 1 tablet daily for 4 days 03/12/23  Yes Haydyn Girvan, Lurena Joiner, PA-C  benzonatate (TESSALON) 100 MG capsule Take 1 capsule (100 mg total) by mouth 3 (three) times daily as needed for cough. 03/12/23  Yes Derrisha Foos, Lurena Joiner, PA-C  doxycycline (VIBRAMYCIN) 100 MG capsule Take 1 capsule (100 mg total) by mouth 2  (two) times daily for 5 days. 03/12/23 03/17/23 Yes Desma Wilkowski, Lurena Joiner, PA-C  ergocalciferol (VITAMIN D2) 1.25 MG (50000 UT) capsule Take 50,000 Units by mouth once a week.   Yes [provider]  Tiotropium Bromide-Olodaterol (STIOLTO RESPIMAT) 2.5-2.5 MCG/ACT AERS Inhale into the lungs.    [provider]  tiZANidine (ZANAFLEX) 4 MG capsule Take 4 mg by mouth 3 (three) times daily.    [provider]  fluticasone (FLONASE) 50 MCG/ACT nasal spray 1 spray by Each Nare route daily. 04/11/18 08/24/19  [provider]    Family History Family History  Problem Relation Age of Onset   Breast cancer Maternal Aunt     Social History Social History   Tobacco Use   Smoking status: Every Day    Current packs/day: 0.50    Types: Cigarettes   Smokeless tobacco: Never  Vaping Use   Vaping status: Never Used  Substance Use Topics   Alcohol use: Never   Drug use: Never     Allergies   Aspirin, Cephalexin, Morphine and codeine, Sulfa antibiotics, Ciprofloxacin, Sulfamethoxazole-trimethoprim, Corticosteroids, Nsaids, Prednisone, and Tape   Review of Systems Review of Systems  Constitutional:  Positive for fever.  Respiratory:  Positive for cough.    Per HPI  Physical Exam Triage Vital Signs ED Triage Vitals  Encounter Vitals Group  BP 03/12/23 1035 127/66     Systolic BP Percentile --      Diastolic BP Percentile --      Pulse Rate 03/12/23 1035 (!) 113     Resp 03/12/23 1035 20     Temp 03/12/23 1035 98.8 F (37.1 C)     Temp Source 03/12/23 1035 Oral     SpO2 03/12/23 1035 94 %     Weight --      Height --      Head Circumference --      Peak Flow --      Pain Score 03/12/23 1030 0     Pain Loc --      Pain Education --      Exclude from Growth Chart --    No data found.  Updated Vital Signs BP 127/66 (BP Location: Right Arm)   Pulse (!) 105   Temp 99 F (37.2 C) (Oral)   Resp 20   SpO2 94%    Physical Exam Vitals and  nursing note reviewed.  Constitutional:      General: She is not in acute distress.    Appearance: She is ill-appearing (chronically).  HENT:     Nose: No congestion or rhinorrhea.     Mouth/Throat:     Mouth: Mucous membranes are moist.     Pharynx: Oropharynx is clear. No posterior oropharyngeal erythema.  Eyes:     Conjunctiva/sclera: Conjunctivae normal.  Cardiovascular:     Rate and Rhythm: Normal rate and regular rhythm.     Pulses: Normal pulses.     Heart sounds: Normal heart sounds.  Pulmonary:     Effort: Pulmonary effort is normal. No tachypnea.     Breath sounds: Decreased breath sounds present.     Comments: Decreased sounds throughout. No wheezing or rales. Wet productive cough in clinic. Unlabored breathing, speaks in full sentences  Musculoskeletal:     Cervical back: Normal range of motion.  Skin:    General: Skin is warm and dry.  Neurological:     Mental Status: She is alert and oriented to person, place, and time.      UC Treatments / Results  Labs (all labs ordered are listed, but only abnormal results are displayed) Labs Reviewed - No data to display  EKG   Radiology DG Chest 2 View  Result Date: 03/12/2023 CLINICAL DATA:  Productive cough EXAM: CHEST - 2 VIEW COMPARISON:  01/19/2018 FINDINGS: The heart size and mediastinal contours are within normal limits. Hyperinflated lungs. Streaky left lower lobe airspace opacity. No pleural effusion or pneumothorax. The visualized skeletal structures are unremarkable. IMPRESSION: Streaky left lower lobe airspace opacity, most suspicious for pneumonia. Electronically Signed   By: Duanne Guess D.O.   On: 03/12/2023 11:44    Procedures Procedures (including critical care time)  Medications Ordered in UC Medications  ipratropium-albuterol (DUONEB) 0.5-2.5 (3) MG/3ML nebulizer solution 3 mL (3 mLs Nebulization Given 03/12/23 1109)    Initial Impression / Assessment and Plan / UC Course  I have reviewed  the triage vital signs and the nursing notes.  Pertinent labs & imaging results that were available during my care of the patient were reviewed by me and considered in my medical decision making (see chart for details).  Chest xray with left lower lobe opacity  Duoneb treatment given  Suspect COPD exacerbation Advised using steroid inhaler daily as prescribed, albuterol 2-3 times daily Double coverage for possible pneumonia  Azithromycin 500 mg x 1  then 250 mg daily x 4 Doxy BID x 5 Tessalon TID prn Close follow with pulmonology  ED precautions  Final Clinical Impressions(s) / UC Diagnoses   Final diagnoses:  COPD exacerbation (HCC)  Pneumonia due to infectious organism, unspecified laterality, unspecified part of lung     Discharge Instructions      I will call you if the radiologist sees something different on xray. I am treating you for a pneumonia and exacerbation of your COPD  Please take BOTH antibiotics as prescribed Azithromycin and doxycycline  The tessalon cough pills can be taken 3x daily. If this medication makes you drowsy, take only one pill before bed.  Please use your steroid inhaler daily as prescribed   Call your lung specialist for follow up appointment. Please go to the emergency department if symptoms worsen.     ED Prescriptions     Medication Sig Dispense Auth. Provider   doxycycline (VIBRAMYCIN) 100 MG capsule Take 1 capsule (100 mg total) by mouth 2 (two) times daily for 5 days. 10 capsule Bridie Colquhoun, PA-C   azithromycin (ZITHROMAX) 250 MG tablet Take 1 tablet (250 mg total) by mouth as directed. Take 2 tablets together on day 1, then take 1 tablet daily for 4 days 6 tablet Sandra Brents, PA-C   benzonatate (TESSALON) 100 MG capsule Take 1 capsule (100 mg total) by mouth 3 (three) times daily as needed for cough. 30 capsule Murlene Revell, Lurena Joiner, PA-C      PDMP not reviewed this encounter.   Marlow Baars, [place] 03/12/23 1148

## 2023-03-12 NOTE — Discharge Instructions (Addendum)
I will call you if the radiologist sees something different on xray. I am treating you for a pneumonia and exacerbation of your COPD  Please take BOTH antibiotics as prescribed Azithromycin and doxycycline  The tessalon cough pills can be taken 3x daily. If this medication makes you drowsy, take only one pill before bed.  Please use your steroid inhaler daily as prescribed   Call your lung specialist for follow up appointment. Please go to the emergency department if symptoms worsen.

## 2023-05-03 ENCOUNTER — Other Ambulatory Visit: Payer: Self-pay | Admitting: Physician Assistant

## 2023-05-03 ENCOUNTER — Encounter: Payer: Self-pay | Admitting: Physician Assistant

## 2023-05-03 DIAGNOSIS — Z1231 Encounter for screening mammogram for malignant neoplasm of breast: Secondary | ICD-10-CM

## 2023-06-10 ENCOUNTER — Ambulatory Visit
Admission: RE | Admit: 2023-06-10 | Discharge: 2023-06-10 | Disposition: A | Discharge status: Home / Self Care | Payer: Medicare HMO | Source: Ambulatory Visit | Attending: Physician Assistant | Admitting: Physician Assistant

## 2023-06-10 DIAGNOSIS — Z1231 Encounter for screening mammogram for malignant neoplasm of breast: Secondary | ICD-10-CM

## 2024-04-12 ENCOUNTER — Encounter (HOSPITAL_COMMUNITY): Payer: Self-pay

## 2024-04-12 ENCOUNTER — Ambulatory Visit (HOSPITAL_COMMUNITY): Admission: EM | Admit: 2024-04-12 | Discharge: 2024-04-12 | Disposition: A | Discharge status: Home / Self Care

## 2024-04-12 DIAGNOSIS — R1032 Left lower quadrant pain: Secondary | ICD-10-CM

## 2024-04-12 DIAGNOSIS — R509 Fever, unspecified: Secondary | ICD-10-CM

## 2024-04-12 LAB — POC COVID19/FLU A&B COMBO
Covid Antigen, POC: NEGATIVE
Influenza A Antigen, POC: NEGATIVE
Influenza B Antigen, POC: NEGATIVE

## 2024-04-12 NOTE — ED Triage Notes (Signed)
 Onset with a diverticulitis flare 2 days ago. States yesterday started having flu like symptoms (fatigue, fever 101.5, congestion, cough). Patient denies any new or aggravating foods. No known sick exposure.   Patient tried Nyquil with mild relief of fever.

## 2024-04-12 NOTE — Discharge Instructions (Addendum)
 Despite extensive discussion of risks, patient declines to go to emergency department for further evaluation of fever, abdominal pain.

## 2024-04-12 NOTE — ED Provider Notes (Signed)
 [facility]    CSN: [id number] Arrival date & time: 04/12/24  1147      History   Chief Complaint Chief Complaint  Patient presents with   Fever   Abdominal Pain    HPI Teresa George is a 63 y.o. female.   This 63 year old female is being seen for concerns of fatigue, fever, nonproductive cough, rhinorrhea onset last night.  She reports scratchy throat as well.  She also reports diverticulitis flare as evidenced by left lower quadrant abdominal pain onset Tuesday.  She denies headache, dizziness, ear pain.  She denies chest pain, shortness of breath.  She denies nausea, vomiting, constipation, diarrhea.     Past Medical History:  Diagnosis Date   Herniated disc, cervical    Lupus    Reflex sympathetic dystrophy    Renal disorder     There are no active problems to display for this patient.   Past Surgical History:  Procedure Laterality Date   ABDOMINAL HYSTERECTOMY     ADENOIDECTOMY     CARPAL TUNNEL RELEASE Left    CHOLECYSTECTOMY     LAPAROSCOPIC LUMBAR SYMPATHECTOMY     medial epicondylectomy     NEUROLYSIS MEDIAN NERVE Left    pyloric obstruction repair     subcutaneous transposition     submuscular transposition     TONSILLECTOMY      OB History   No obstetric history on file.      Home Medications    Prior to Admission medications  Medication Sig Start Date End Date Taking? Authorizing Provider  albuterol  (PROVENTIL  HFA;VENTOLIN  HFA) 108 (90 Base) MCG/ACT inhaler Inhale into the lungs. 10/03/14  Yes [provider]  ergocalciferol (VITAMIN D2) 1.25 MG (50000 UT) capsule Take 50,000 Units by mouth once a week.   Yes [provider]  oxyCODONE (ROXICODONE) 15 MG immediate release tablet Take 15 mg by mouth 4 (four) times daily as needed.   Yes [provider]  Tiotropium Bromide-Olodaterol (STIOLTO RESPIMAT) 2.5-2.5 MCG/ACT AERS Inhale into the lungs.   Yes [provider]  tiZANidine (ZANAFLEX) 4  MG capsule Take 4 mg by mouth 3 (three) times daily.   Yes [provider]  naloxone [facility] nasal spray 4 mg/0.1 mL  04/13/17   [provider]  fluticasone (FLONASE) 50 MCG/ACT nasal spray 1 spray by Each Nare route daily. 04/11/18 08/24/19  [provider]    Family History Family History  Problem Relation Age of Onset   Breast cancer Maternal Aunt     Social History Social History[1]   Allergies   Aspirin, Cephalexin, Morphine and codeine, Sulfa antibiotics, Ciprofloxacin, Sulfamethoxazole-trimethoprim, Corticosteroids, Nsaids, Prednisone, and Tape   Review of Systems Review of Systems  Constitutional:  Positive for activity change, appetite change, chills, fatigue and fever.  HENT:  Positive for congestion, postnasal drip, rhinorrhea and sore throat. Negative for ear pain, sinus pressure, sinus pain, trouble swallowing and voice change.   Respiratory:  Positive for cough. Negative for shortness of breath.   Cardiovascular:  Negative for chest pain.  Gastrointestinal:  Positive for abdominal pain. Negative for blood in stool, constipation, diarrhea, nausea and vomiting.  Genitourinary:  Negative for difficulty urinating, dysuria, frequency and urgency.  Skin:  Negative for color change and rash.  Neurological:  Negative for dizziness and headaches.  All other systems reviewed and are negative.    Physical Exam Triage Vital Signs ED Triage Vitals  Encounter Vitals Group     BP  Girls Systolic BP Percentile      Girls Diastolic BP Percentile      Boys Systolic BP Percentile      Boys Diastolic BP Percentile      Pulse      Resp      Temp      Temp src      SpO2      Weight      Height      Head Circumference      Peak Flow      Pain Score      Pain Loc      Pain Education      Exclude from Growth Chart    No data found.  Updated Vital Signs BP 97/66 (BP Location: Right Arm)   Pulse 98   Temp 98.2 F (36.8 C) (Oral)   Resp  18   SpO2 96%   Visual Acuity Right Eye Distance:   Left Eye Distance:   Bilateral Distance:    Right Eye Near:   Left Eye Near:    Bilateral Near:     Physical Exam Vitals and nursing note reviewed.  Constitutional:      General: She is awake. She is not in acute distress.    Appearance: Normal appearance. She is well-developed. She is not ill-appearing or toxic-appearing.     Comments: Pleasant female appearing stated age found sitting in chair in no acute distress.  HENT:     Head: Normocephalic and atraumatic.     Right Ear: Tympanic membrane and external ear normal.     Left Ear: Tympanic membrane and external ear normal.     Nose: Rhinorrhea present.     Right Turbinates: Not enlarged.     Left Turbinates: Not enlarged.     Mouth/Throat:     Lips: Pink.     Mouth: Mucous membranes are moist.     Pharynx: No oropharyngeal exudate or posterior oropharyngeal erythema.  Eyes:     Conjunctiva/sclera: Conjunctivae normal.  Cardiovascular:     Rate and Rhythm: Normal rate and regular rhythm.     Heart sounds: Normal heart sounds. No murmur heard. Pulmonary:     Effort: Pulmonary effort is normal. No respiratory distress.     Breath sounds: Normal breath sounds.  Abdominal:     General: Bowel sounds are normal.     Palpations: Abdomen is soft.     Tenderness: There is abdominal tenderness in the left lower quadrant. There is no guarding or rebound.  Skin:    General: Skin is warm and dry.     Capillary Refill: Capillary refill takes less than 2 seconds.  Neurological:     Mental Status: She is alert.  Psychiatric:        Mood and Affect: Mood normal.        Behavior: Behavior is cooperative.      UC Treatments / Results  Labs (all labs ordered are listed, but only abnormal results are displayed) Labs Reviewed  POC COVID19/FLU A&B COMBO    EKG   Radiology No results found.  Procedures Procedures (including critical care time)  Medications Ordered in  UC Medications - No data to display  Initial Impression / Assessment and Plan / UC Course  I have reviewed the triage vital signs and the nursing notes.  Pertinent labs & imaging results that were available during my care of the patient were reviewed by me and considered in my medical decision making (see  chart for details).     Vitals and triage reviewed.  COVID/flu swab is negative.  Patient has soft blood pressure.  Her pulse is elevated although still within normal range.  She reports fever of 101.5 last night.  She reports abdominal symptoms consistent with diverticulitis for 2 days.  She has numerous allergies to medications typically used to treat diverticulitis.  She has history of lupus.  Due to vital signs and extensive allergy list, patient is advised to present to emergency department for further evaluation and possible IV antibiotics.  Patient refuses to present to emergency department.  Extensive discussion regarding risks.  Patient verbalizes understanding and has signed AMA paperwork.  At time of discharge, she is ambulatory without incident. Final Clinical Impressions(s) / UC Diagnoses   Final diagnoses:  Fever, unspecified  Left lower quadrant abdominal pain     Discharge Instructions      Despite extensive discussion of risks, patient declines to go to emergency department for further evaluation of fever, abdominal pain.     ED Prescriptions   None    I have reviewed the PDMP during this encounter.    [1]  Social History Tobacco Use   Smoking status: Every Day    Current packs/day: 0.50    Types: Cigarettes   Smokeless tobacco: Never  Vaping Use   Vaping status: Never Used  Substance Use Topics   Alcohol use: Never   Drug use: Never     Lennice Jon BROCKS, FNP 04/12/24 1311

## 2024-04-14 ENCOUNTER — Emergency Department (HOSPITAL_BASED_OUTPATIENT_CLINIC_OR_DEPARTMENT_OTHER)

## 2024-04-14 ENCOUNTER — Encounter (HOSPITAL_BASED_OUTPATIENT_CLINIC_OR_DEPARTMENT_OTHER): Payer: Self-pay

## 2024-04-14 ENCOUNTER — Other Ambulatory Visit: Payer: Self-pay

## 2024-04-14 ENCOUNTER — Emergency Department (HOSPITAL_BASED_OUTPATIENT_CLINIC_OR_DEPARTMENT_OTHER)
Admission: EM | Admit: 2024-04-14 | Discharge: 2024-04-14 | Disposition: A | Discharge status: Home / Self Care | Attending: Emergency Medicine | Admitting: Emergency Medicine

## 2024-04-14 DIAGNOSIS — R059 Cough, unspecified: Secondary | ICD-10-CM | POA: Insufficient documentation

## 2024-04-14 DIAGNOSIS — K5792 Diverticulitis of intestine, part unspecified, without perforation or abscess without bleeding: Secondary | ICD-10-CM | POA: Insufficient documentation

## 2024-04-14 DIAGNOSIS — Z7951 Long term (current) use of inhaled steroids: Secondary | ICD-10-CM | POA: Insufficient documentation

## 2024-04-14 DIAGNOSIS — J449 Chronic obstructive pulmonary disease, unspecified: Secondary | ICD-10-CM | POA: Insufficient documentation

## 2024-04-14 DIAGNOSIS — R1030 Lower abdominal pain, unspecified: Secondary | ICD-10-CM | POA: Diagnosis present

## 2024-04-14 LAB — CBC
HCT: 38.4 % (ref 36.0–46.0)
Hemoglobin: 13.3 g/dL (ref 12.0–15.0)
MCH: 32.4 pg (ref 26.0–34.0)
MCHC: 34.6 g/dL (ref 30.0–36.0)
MCV: 93.4 fL (ref 80.0–100.0)
Platelets: 173 K/uL (ref 150–400)
RBC: 4.11 MIL/uL (ref 3.87–5.11)
RDW: 12.1 % (ref 11.5–15.5)
WBC: 4.8 K/uL (ref 4.0–10.5)
nRBC: 0 % (ref 0.0–0.2)

## 2024-04-14 LAB — COMPREHENSIVE METABOLIC PANEL WITH GFR
ALT: 18 U/L (ref 0–44)
AST: 23 U/L (ref 15–41)
Albumin: 4.3 g/dL (ref 3.5–5.0)
Alkaline Phosphatase: 127 U/L — ABNORMAL HIGH (ref 38–126)
Anion gap: 13 (ref 5–15)
BUN: 12 mg/dL (ref 8–23)
CO2: 25 mmol/L (ref 22–32)
Calcium: 9.9 mg/dL (ref 8.9–10.3)
Chloride: 104 mmol/L (ref 98–111)
Creatinine, Ser: 1.08 mg/dL — ABNORMAL HIGH (ref 0.44–1.00)
GFR, Estimated: 57 mL/min — ABNORMAL LOW
Glucose, Bld: 106 mg/dL — ABNORMAL HIGH (ref 70–99)
Potassium: 3.5 mmol/L (ref 3.5–5.1)
Sodium: 141 mmol/L (ref 135–145)
Total Bilirubin: 0.4 mg/dL (ref 0.0–1.2)
Total Protein: 7 g/dL (ref 6.5–8.1)

## 2024-04-14 LAB — URINALYSIS, ROUTINE W REFLEX MICROSCOPIC
Bilirubin Urine: NEGATIVE
Glucose, UA: NEGATIVE mg/dL
Hgb urine dipstick: NEGATIVE
Ketones, ur: NEGATIVE mg/dL
Leukocytes,Ua: NEGATIVE
Nitrite: NEGATIVE
Protein, ur: NEGATIVE mg/dL
Specific Gravity, Urine: <1.005 — ABNORMAL LOW (ref 1.005–1.030)
pH: 6.5 (ref 5.0–8.0)

## 2024-04-14 LAB — RESP PANEL BY RT-PCR (RSV, FLU A&B, COVID)  RVPGX2
Influenza A by PCR: NEGATIVE
Influenza B by PCR: NEGATIVE
Resp Syncytial Virus by PCR: NEGATIVE
SARS Coronavirus 2 by RT PCR: NEGATIVE

## 2024-04-14 LAB — LIPASE, BLOOD: Lipase: 20 U/L (ref 11–51)

## 2024-04-14 MED ORDER — IOPAMIDOL (ISOVUE-300) INJECTION 61%: 100.0000 mL | Freq: Once | INTRAVENOUS | Status: DC | PRN

## 2024-04-14 MED ORDER — AMOXICILLIN-POT CLAVULANATE 875-125 MG PO TABS: 1.0000 | ORAL_TABLET | Freq: Two times a day (BID) | ORAL | 0 refills | Status: AC

## 2024-04-14 MED ORDER — IOHEXOL 300 MG/ML  SOLN
100.0000 mL | Freq: Once | INTRAMUSCULAR | Status: AC | PRN
  Administered 2024-04-14: 100 mL via INTRAVENOUS

## 2024-04-14 MED ORDER — ALBUTEROL SULFATE HFA 108 (90 BASE) MCG/ACT IN AERS
2.0000 | INHALATION_SPRAY | Freq: Once | RESPIRATORY_TRACT | Status: AC
  Administered 2024-04-14: 2 via RESPIRATORY_TRACT
  Filled 2024-04-14: qty 6.7

## 2024-04-14 MED ORDER — AMOXICILLIN-POT CLAVULANATE 875-125 MG PO TABS
1.0000 | ORAL_TABLET | Freq: Once | ORAL | Status: AC
  Administered 2024-04-14: 1 via ORAL
  Filled 2024-04-14: qty 1

## 2024-04-14 NOTE — Discharge Instructions (Signed)
 Take next dose of Augmentin  tomorrow.  You do have mild diverticulitis.

## 2024-04-14 NOTE — ED Notes (Signed)
 Reviewed discharge instructions, medications, and home care with pt. Pt verbalized understanding and had no further questions. Pt exited ED without complications.

## 2024-04-14 NOTE — ED Triage Notes (Signed)
 She c/o lower abd. Pain. She recognizes it as diverticulitis. She is ambulatory and in no distress.

## 2024-04-14 NOTE — ED Provider Notes (Signed)
 " [place] [facility] AT [facility] Provider Note   CSN: [id number] Arrival date & time: 04/14/24  1114     Patient presents with: Abdominal Pain   Arushi Ariely Riddell is a 63 y.o. female.   Patient here with cough lower abdominal pain fever now resolved.  Generalized weakness.  Nothing makes it worse or better.  She took Cipro for diverticulitis but it made her feel some hallucinations.  She denies any weakness numbness tingling.  She has had a dry cough for the last several days.  Fever earlier this week resolved.  She does have COPD history.  She has reflex sympathetic dystrophy as well.  History of lupus.  Denies any pain with urination.  No weakness numbness tingling.  No headache.  The history is provided by the patient.       Prior to Admission medications  Medication Sig Start Date End Date Taking? Authorizing Provider  amoxicillin -clavulanate (AUGMENTIN ) 875-125 MG tablet Take 1 tablet by mouth every 12 (twelve) hours. 04/14/24  Yes Hershall Benkert, DO  albuterol  (PROVENTIL  HFA;VENTOLIN  HFA) 108 (90 Base) MCG/ACT inhaler Inhale into the lungs. 10/03/14   [provider]  ergocalciferol (VITAMIN D2) 1.25 MG (50000 UT) capsule Take 50,000 Units by mouth once a week.    [provider]  naloxone [facility]) nasal spray 4 mg/0.1 mL  04/13/17   [provider]  oxyCODONE (ROXICODONE) 15 MG immediate release tablet Take 15 mg by mouth 4 (four) times daily as needed.    [provider]  Tiotropium Bromide-Olodaterol (STIOLTO RESPIMAT) 2.5-2.5 MCG/ACT AERS Inhale into the lungs.    [provider]  tiZANidine (ZANAFLEX) 4 MG capsule Take 4 mg by mouth 3 (three) times daily.    [provider]  fluticasone (FLONASE) 50 MCG/ACT nasal spray 1 spray by Each Nare route daily. 04/11/18 08/24/19  [provider]    Allergies: Aspirin, Cephalexin, Morphine and codeine, Sulfa antibiotics, Ciprofloxacin,  Sulfamethoxazole-trimethoprim, Corticosteroids, Nsaids, Prednisone, and Tape    Review of Systems  Updated Vital Signs BP 133/68 (BP Location: Right Arm)   Pulse 98   Temp 98.6 F (37 C) (Oral)   Resp 20   SpO2 98%   Physical Exam Vitals and nursing note reviewed.  Constitutional:      General: She is not in acute distress.    Appearance: She is well-developed. She is not ill-appearing.  HENT:     Head: Normocephalic and atraumatic.     Mouth/Throat:     Mouth: Mucous membranes are moist.  Eyes:     Conjunctiva/sclera: Conjunctivae normal.  Cardiovascular:     Rate and Rhythm: Normal rate and regular rhythm.     Heart sounds: Normal heart sounds. No murmur heard. Pulmonary:     Effort: Pulmonary effort is normal. No respiratory distress.     Breath sounds: Normal breath sounds.  Abdominal:     Palpations: Abdomen is soft.     Tenderness: There is abdominal tenderness.  Musculoskeletal:        General: No swelling.     Cervical back: Neck supple.  Skin:    General: Skin is warm and dry.     Capillary Refill: Capillary refill takes less than 2 seconds.  Neurological:     Mental Status: She is alert.  Psychiatric:        Mood and Affect: Mood normal.     (all labs ordered are listed, but only abnormal results are displayed) Labs Reviewed  COMPREHENSIVE  METABOLIC PANEL WITH GFR - Abnormal; Notable for the following components:      Result Value   Glucose, Bld 106 (*)    Creatinine, Ser 1.08 (*)    Alkaline Phosphatase 127 (*)    GFR, Estimated 57 (*)    All other components within normal limits  URINALYSIS, ROUTINE W REFLEX MICROSCOPIC - Abnormal; Notable for the following components:   Color, Urine COLORLESS (*)    Specific Gravity, Urine <1.005 (*)    All other components within normal limits  RESP PANEL BY RT-PCR (RSV, FLU A&B, COVID)  RVPGX2  LIPASE, BLOOD  CBC    EKG: None  Radiology: CT ABDOMEN PELVIS W CONTRAST Result Date: 04/14/2024 EXAM: CT  ABDOMEN AND PELVIS WITH CONTRAST 04/14/2024 01:15:17 PM TECHNIQUE: CT of the abdomen and pelvis was performed with the administration of intravenous contrast. 100 mL of iohexol  (OMNIPAQUE ) 300 MG/ML solution was administered. Multiplanar reformatted images are provided for review. Automated exposure control, iterative reconstruction, and/or weight-based adjustment of the mA/kV was utilized to reduce the radiation dose to as low as reasonably achievable. COMPARISON: 04/16/2021 CLINICAL HISTORY: Abdominal pain, acute, nonlocalized. FINDINGS: LOWER CHEST: No acute abnormality. LIVER: No mass. Mild intrahepatic and extrahepatic biliary ductal dilation, likely related to the prior cholecystectomy. GALLBLADDER AND BILE DUCTS: Status post cholecystectomy. Mild intrahepatic and extrahepatic biliary ductal dilation, likely related to the prior cholecystectomy. SPLEEN: No acute abnormality. PANCREAS: No acute abnormality. ADRENAL GLANDS: Unchanged subcentimeter hypodensity in the left adrenal gland (axial 16), likely a benign adenoma. KIDNEYS, URETERS AND BLADDER: Subcentimeter hypodensities in both kidneys, too small to definitively characterize, unchanged and likely small cysts. Per consensus, no follow-up is needed for simple Bosniak type 1 and 2 renal cysts, unless the patient has a malignancy history or risk factors. No stones in the kidneys or ureters. No hydronephrosis. No perinephric or periureteral stranding. Partially distended urinary bladder, without focal abnormality. GI AND BOWEL: Stomach demonstrates no acute abnormality. Total colonic diverticulosis. Focal inflammatory stranding surrounding a couple of diverticula in the sigmoid colon. No peridiverticular abscess or pneumoperitoneum. There is no bowel obstruction. PERITONEUM AND RETROPERITONEUM: No ascites. No free air. No free pelvic fluid. VASCULATURE: Aorta is normal in caliber. Diffuse calcified atherosclerosis throughout the aorta and iliac arteries.  LYMPH NODES: No lymphadenopathy. REPRODUCTIVE ORGANS: Status post hysterectomy. BONES AND SOFT TISSUES: Osteopenia. Scattered multilevel osteophytosis. No acute osseous abnormality. No focal soft tissue abnormality. IMPRESSION: 1. Acute, uncomplicated diverticulitis of the sigmoid colon. No peridiverticular abscess or pneumoperitoneum. Electronically signed by: Rogelia Myers MD 04/14/2024 01:33 PM EST RP Workstation: [id number]   DG Chest Portable 1 View Result Date: 04/14/2024 EXAM: 1 VIEW(S) XRAY OF THE CHEST 04/14/2024 12:27:31 PM COMPARISON: 03/12/2023 CLINICAL HISTORY: cough FINDINGS: LUNGS AND PLEURA: Hyperinflated lungs. No focal pulmonary opacity. No pleural effusion. No pneumothorax. HEART AND MEDIASTINUM: No acute abnormality of the cardiac and mediastinal silhouettes. BONES AND SOFT TISSUES: Similar sclerotic focus along right humeral head, likely benign. No acute osseous abnormality. IMPRESSION: 1. No acute cardiopulmonary findings. 2. Hyperinflated lungs. Electronically signed by: Waddell Calk MD 04/14/2024 12:37 PM EST RP Workstation: [id number]     Procedures   Medications Ordered in the ED  iopamidol  (ISOVUE -300) 61 % injection 100 mL ( Intravenous Canceled Entry 04/14/24 1316)  amoxicillin -clavulanate (AUGMENTIN ) 875-125 MG per tablet 1 tablet (has no administration in time range)  albuterol  (VENTOLIN  HFA) 108 (90 Base) MCG/ACT inhaler 2 puff (2 puffs Inhalation Given 04/14/24 1218)  iohexol  (OMNIPAQUE ) 300 MG/ML solution 100 mL (100 mLs  Intravenous Contrast Given 04/14/24 1316)                                    Medical Decision Making Amount and/or Complexity of Data Reviewed Labs: ordered. Radiology: ordered.  Risk Prescription drug management.   Maribelle Rivers Gassmann is here with fever cough abdominal pain.  Normal vitals.  No fever.  Will get CT scan CBC CMP lipase urinalysis COVID flu RSV test chest x-ray.  Overall she is well-appearing.  Very mildly tender on exam.   She took ciprofloxacin for presumed diverticulitis but it made her feel hallucinations.  Overall I do suspect this is likely a viral process given her dry cough on exam.  Fever earlier in the week now resolved.  Will reevaluate for flu as she was tested for this earlier in the week.  Will get chest x-ray and labs and reevaluate.  Lab work is unremarkable per my review interpretation.  CT scan does show some mild diverticulitis.  Will treat with Augmentin .  She has not had any issues with this antibiotic in the past despite allergy to Keflex in her chart.  She just had this recently as well.  Discharged in good condition.  Understands return precautions.  This chart was dictated using voice recognition software.  Despite best efforts to proofread,  errors can occur which can change the documentation meaning.      Final diagnoses:  Diverticulitis    ED Discharge Orders          Ordered    amoxicillin -clavulanate (AUGMENTIN ) 875-125 MG tablet  Every 12 hours        04/14/24 1337               Ruthe Cornet, DO 04/14/24 1338  "

## 2024-05-19 NOTE — Progress Notes (Signed)
 OK
